# Patient Record
Sex: Female | Born: 1940 | Race: Black or African American | Hispanic: No | Marital: Married | Smoking: Never smoker
Health system: Southern US, Community
[De-identification: ages and names within clinical notes are randomized; demographics above are authoritative.]

## PROBLEM LIST (undated history)

## (undated) DIAGNOSIS — J45909 Unspecified asthma, uncomplicated: Secondary | ICD-10-CM

## (undated) DIAGNOSIS — I499 Cardiac arrhythmia, unspecified: Secondary | ICD-10-CM

## (undated) DIAGNOSIS — D126 Benign neoplasm of colon, unspecified: Secondary | ICD-10-CM

## (undated) DIAGNOSIS — E7800 Pure hypercholesterolemia, unspecified: Secondary | ICD-10-CM

## (undated) DIAGNOSIS — M4800 Spinal stenosis, site unspecified: Secondary | ICD-10-CM

## (undated) DIAGNOSIS — I10 Essential (primary) hypertension: Secondary | ICD-10-CM

## (undated) DIAGNOSIS — E669 Obesity, unspecified: Secondary | ICD-10-CM

## (undated) DIAGNOSIS — E119 Type 2 diabetes mellitus without complications: Secondary | ICD-10-CM

## (undated) DIAGNOSIS — K5790 Diverticulosis of intestine, part unspecified, without perforation or abscess without bleeding: Secondary | ICD-10-CM

## (undated) HISTORY — PX: APPENDECTOMY: SHX54

## (undated) HISTORY — PX: BACK SURGERY: SHX140

## (undated) HISTORY — PX: CARDIAC CATHETERIZATION: SHX172

## (undated) HISTORY — PX: EXPLORATION OF SPINAL FUSION: SUR587

## (undated) HISTORY — PX: TUBAL LIGATION: SHX77

## (undated) HISTORY — PX: COLONOSCOPY: SHX174

## (undated) HISTORY — PX: BREAST BIOPSY: SHX20

## (undated) HISTORY — PX: NECK SURGERY: SHX720

## (undated) HISTORY — PX: ABDOMINAL HYSTERECTOMY: SHX81

---

## 2004-02-16 ENCOUNTER — Encounter: Admission: RE | Admit: 2004-02-16 | Discharge: 2004-02-16 | Payer: Self-pay | Admitting: Unknown Physician Specialty

## 2004-10-10 ENCOUNTER — Encounter: Admission: RE | Admit: 2004-10-10 | Discharge: 2004-10-10 | Payer: Self-pay | Admitting: Internal Medicine

## 2004-11-10 ENCOUNTER — Observation Stay (HOSPITAL_COMMUNITY): Admission: RE | Admit: 2004-11-10 | Discharge: 2004-11-11 | Payer: Self-pay | Admitting: Neurosurgery

## 2004-12-03 ENCOUNTER — Ambulatory Visit: Payer: Self-pay | Admitting: Internal Medicine

## 2005-01-14 ENCOUNTER — Inpatient Hospital Stay (HOSPITAL_COMMUNITY): Admission: RE | Admit: 2005-01-14 | Discharge: 2005-01-15 | Payer: Self-pay | Admitting: Neurosurgery

## 2005-12-14 ENCOUNTER — Ambulatory Visit: Payer: Self-pay | Admitting: Internal Medicine

## 2007-01-03 ENCOUNTER — Ambulatory Visit: Payer: Self-pay | Admitting: Internal Medicine

## 2007-01-26 ENCOUNTER — Ambulatory Visit: Payer: Self-pay | Admitting: Internal Medicine

## 2007-07-25 ENCOUNTER — Ambulatory Visit: Payer: Self-pay | Admitting: Gastroenterology

## 2008-02-07 ENCOUNTER — Ambulatory Visit: Payer: Self-pay | Admitting: Internal Medicine

## 2008-12-30 ENCOUNTER — Ambulatory Visit: Payer: Self-pay | Admitting: Cardiovascular Disease

## 2009-01-03 ENCOUNTER — Ambulatory Visit: Payer: Self-pay | Admitting: Cardiovascular Disease

## 2009-03-19 ENCOUNTER — Ambulatory Visit: Payer: Self-pay | Admitting: Internal Medicine

## 2010-06-24 ENCOUNTER — Ambulatory Visit: Payer: Self-pay | Admitting: Gastroenterology

## 2010-07-13 ENCOUNTER — Ambulatory Visit: Payer: Self-pay | Admitting: Internal Medicine

## 2010-07-16 ENCOUNTER — Ambulatory Visit: Payer: Self-pay | Admitting: Internal Medicine

## 2011-01-29 ENCOUNTER — Emergency Department: Payer: Self-pay | Admitting: Emergency Medicine

## 2011-08-20 ENCOUNTER — Ambulatory Visit: Payer: Self-pay | Admitting: Internal Medicine

## 2011-10-28 ENCOUNTER — Ambulatory Visit: Payer: Self-pay | Admitting: Internal Medicine

## 2012-04-04 ENCOUNTER — Other Ambulatory Visit: Payer: Self-pay | Admitting: Neurosurgery

## 2012-04-04 DIAGNOSIS — IMO0002 Reserved for concepts with insufficient information to code with codable children: Secondary | ICD-10-CM

## 2012-04-04 DIAGNOSIS — M545 Low back pain: Secondary | ICD-10-CM

## 2012-04-04 DIAGNOSIS — Q762 Congenital spondylolisthesis: Secondary | ICD-10-CM

## 2012-04-04 DIAGNOSIS — M47817 Spondylosis without myelopathy or radiculopathy, lumbosacral region: Secondary | ICD-10-CM

## 2012-04-11 ENCOUNTER — Ambulatory Visit
Admission: RE | Admit: 2012-04-11 | Discharge: 2012-04-11 | Disposition: A | Discharge status: Home / Self Care | Payer: Medicare Other | Source: Ambulatory Visit | Attending: Neurosurgery | Admitting: Neurosurgery

## 2012-04-11 DIAGNOSIS — Q762 Congenital spondylolisthesis: Secondary | ICD-10-CM

## 2012-04-11 DIAGNOSIS — M47817 Spondylosis without myelopathy or radiculopathy, lumbosacral region: Secondary | ICD-10-CM

## 2012-04-11 DIAGNOSIS — IMO0002 Reserved for concepts with insufficient information to code with codable children: Secondary | ICD-10-CM

## 2012-04-11 DIAGNOSIS — M545 Low back pain: Secondary | ICD-10-CM

## 2012-04-11 MED ORDER — GADOBENATE DIMEGLUMINE 529 MG/ML IV SOLN
20.0000 mL | Freq: Once | INTRAVENOUS | Status: AC | PRN
  Administered 2012-04-11: 20 mL via INTRAVENOUS

## 2012-12-01 ENCOUNTER — Ambulatory Visit: Payer: Self-pay | Admitting: Internal Medicine

## 2013-08-22 ENCOUNTER — Ambulatory Visit: Payer: Self-pay | Admitting: Family Medicine

## 2013-08-26 ENCOUNTER — Ambulatory Visit: Payer: Self-pay | Admitting: Physician Assistant

## 2014-02-04 ENCOUNTER — Ambulatory Visit: Payer: Self-pay | Admitting: Internal Medicine

## 2014-10-07 ENCOUNTER — Ambulatory Visit: Payer: Self-pay | Admitting: Internal Medicine

## 2014-10-15 ENCOUNTER — Ambulatory Visit
Admit: 2014-10-15 | Disposition: A | Discharge status: Home / Self Care | Payer: Self-pay | Attending: Internal Medicine | Admitting: Internal Medicine

## 2014-10-31 ENCOUNTER — Ambulatory Visit: Payer: Self-pay | Admitting: Gastroenterology

## 2014-10-31 DIAGNOSIS — D126 Benign neoplasm of colon, unspecified: Secondary | ICD-10-CM

## 2014-10-31 HISTORY — DX: Benign neoplasm of colon, unspecified: D12.6

## 2014-11-15 ENCOUNTER — Ambulatory Visit
Admit: 2014-11-15 | Disposition: A | Discharge status: Home / Self Care | Payer: Self-pay | Attending: Internal Medicine | Admitting: Internal Medicine

## 2014-12-09 LAB — SURGICAL PATHOLOGY

## 2015-03-18 ENCOUNTER — Other Ambulatory Visit: Payer: Self-pay | Admitting: Obstetrics and Gynecology

## 2015-04-14 ENCOUNTER — Other Ambulatory Visit: Payer: Self-pay | Admitting: Internal Medicine

## 2015-04-14 DIAGNOSIS — Z1231 Encounter for screening mammogram for malignant neoplasm of breast: Secondary | ICD-10-CM

## 2015-04-16 ENCOUNTER — Ambulatory Visit
Admission: RE | Admit: 2015-04-16 | Discharge: 2015-04-16 | Disposition: A | Discharge status: Home / Self Care | Payer: Medicare Other | Source: Ambulatory Visit | Attending: Internal Medicine | Admitting: Internal Medicine

## 2015-04-16 ENCOUNTER — Other Ambulatory Visit: Payer: Self-pay | Admitting: Internal Medicine

## 2015-04-16 DIAGNOSIS — Z1231 Encounter for screening mammogram for malignant neoplasm of breast: Secondary | ICD-10-CM | POA: Diagnosis present

## 2015-06-19 ENCOUNTER — Encounter: Payer: Self-pay | Admitting: Emergency Medicine

## 2015-06-19 ENCOUNTER — Ambulatory Visit
Admission: EM | Admit: 2015-06-19 | Discharge: 2015-06-19 | Disposition: A | Discharge status: Home / Self Care | Payer: Medicare Other | Attending: Family Medicine | Admitting: Family Medicine

## 2015-06-19 DIAGNOSIS — J0100 Acute maxillary sinusitis, unspecified: Secondary | ICD-10-CM

## 2015-06-19 DIAGNOSIS — R0602 Shortness of breath: Secondary | ICD-10-CM | POA: Diagnosis not present

## 2015-06-19 DIAGNOSIS — J40 Bronchitis, not specified as acute or chronic: Secondary | ICD-10-CM

## 2015-06-19 DIAGNOSIS — H66001 Acute suppurative otitis media without spontaneous rupture of ear drum, right ear: Secondary | ICD-10-CM | POA: Diagnosis not present

## 2015-06-19 HISTORY — DX: Type 2 diabetes mellitus without complications: E11.9

## 2015-06-19 HISTORY — DX: Unspecified asthma, uncomplicated: J45.909

## 2015-06-19 HISTORY — DX: Essential (primary) hypertension: I10

## 2015-06-19 MED ORDER — METHYLPREDNISOLONE SODIUM SUCC 125 MG IJ SOLR
125.0000 mg | Freq: Once | INTRAMUSCULAR | Status: AC
  Administered 2015-06-19: 125 mg via INTRAMUSCULAR

## 2015-06-19 MED ORDER — PREDNISONE 10 MG (21) PO TBPK: ORAL_TABLET | ORAL | Status: DC

## 2015-06-19 MED ORDER — HYDROCOD POLST-CPM POLST ER 10-8 MG/5ML PO SUER: 5.0000 mL | Freq: Two times a day (BID) | ORAL | Status: DC | PRN

## 2015-06-19 MED ORDER — FEXOFENADINE-PSEUDOEPHED ER 180-240 MG PO TB24: 1.0000 | ORAL_TABLET | Freq: Every day | ORAL | Status: DC

## 2015-06-19 MED ORDER — IPRATROPIUM-ALBUTEROL 0.5-2.5 (3) MG/3ML IN SOLN
3.0000 mL | Freq: Once | RESPIRATORY_TRACT | Status: AC
  Administered 2015-06-19: 3 mL via RESPIRATORY_TRACT

## 2015-06-19 MED ORDER — AMOXICILLIN-POT CLAVULANATE 875-125 MG PO TABS: 1.0000 | ORAL_TABLET | Freq: Two times a day (BID) | ORAL | Status: DC

## 2015-06-19 NOTE — Discharge Instructions (Signed)
Sinusitis, Adult Sinusitis is redness, soreness, and puffiness (inflammation) of the air pockets in the bones of your face (sinuses). The redness, soreness, and puffiness can cause air and mucus to get trapped in your sinuses. This can allow germs to grow and cause an infection.  HOME CARE   Drink enough fluids to keep your pee (urine) clear or pale yellow.  Use a humidifier in your home.  Run a hot shower to create steam in the bathroom. Sit in the bathroom with the door closed. Breathe in the steam 3-4 times a day.  Put a warm, moist washcloth on your face 3-4 times a day, or as told by your doctor.  Use salt water sprays (saline sprays) to wet the thick fluid in your nose. This can help the sinuses drain.  Only take medicine as told by your doctor. GET HELP RIGHT AWAY IF:   Your pain gets worse.  You have very bad headaches.  You are sick to your stomach (nauseous).  You throw up (vomit).  You are very sleepy (drowsy) all the time.  Your face is puffy (swollen).  Your vision changes.  You have a stiff neck.  You have trouble breathing. MAKE SURE YOU:   Understand these instructions.  Will watch your condition.  Will get help right away if you are not doing well or get worse.   This information is not intended to replace advice given to you by your health care provider. Make sure you discuss any questions you have with your health care provider.   Document Released: 01/19/2008 Document Revised: 08/23/2014 Document Reviewed: 03/07/2012 Elsevier Interactive Patient Education 2016 Elsevier Inc.  Upper Respiratory Infection, Adult Most upper respiratory infections (URIs) are caused by a virus. A URI affects the nose, throat, and upper air passages. The most common type of URI is often called "the common cold." HOME CARE   Take medicines only as told by your doctor.  Gargle warm saltwater or take cough drops to comfort your throat as told by your doctor.  Use a  warm mist humidifier or inhale steam from a shower to increase air moisture. This may make it easier to breathe.  Drink enough fluid to keep your pee (urine) clear or pale yellow.  Eat soups and other clear broths.  Have a healthy diet.  Rest as needed.  Go back to work when your fever is gone or your doctor says it is okay.  You may need to stay home longer to avoid giving your URI to others.  You can also wear a face mask and wash your hands often to prevent spread of the virus.  Use your inhaler more if you have asthma.  Do not use any tobacco products, including cigarettes, chewing tobacco, or electronic cigarettes. If you need help quitting, ask your doctor. GET HELP IF:  You are getting worse, not better.  Your symptoms are not helped by medicine.  You have chills.  You are getting more short of breath.  You have brown or red mucus.  You have yellow or brown discharge from your nose.  You have pain in your face, especially when you bend forward.  You have a fever.  You have puffy (swollen) neck glands.  You have pain while swallowing.  You have white areas in the back of your throat. GET HELP RIGHT AWAY IF:   You have very bad or constant:  Headache.  Ear pain.  Pain in your forehead, behind your eyes, and over  your cheekbones (sinus pain).  Chest pain.  You have long-lasting (chronic) lung disease and any of the following:  Wheezing.  Long-lasting cough.  Coughing up blood.  A change in your usual mucus.  You have a stiff neck.  You have changes in your:  Vision.  Hearing.  Thinking.  Mood. MAKE SURE YOU:   Understand these instructions.  Will watch your condition.  Will get help right away if you are not doing well or get worse.   This information is not intended to replace advice given to you by your health care provider. Make sure you discuss any questions you have with your health care provider.   Document Released:  01/19/2008 Document Revised: 12/17/2014 Document Reviewed: 11/07/2013 Elsevier Interactive Patient Education [employer].

## 2015-06-19 NOTE — ED Notes (Signed)
Patient states she has a lot of congestion both sinus and chest, started yesterday

## 2015-06-19 NOTE — ED Provider Notes (Signed)
CSN: [id number]     Arrival date & time 06/19/15  1227 History   First MD Initiated Contact with Patient 06/19/15 1311     Chief Complaint  Patient presents with  . Nasal Congestion   (Consider location/radiation/quality/duration/timing/severity/associated sxs/prior Treatment) Patient is a 74 y.o. female presenting with shortness of breath. The history is provided by the patient. No language interpreter was used.  Shortness of Breath Severity:  Moderate Onset quality:  Sudden Duration:  2 days Timing:  Constant Progression:  Unable to specify Chronicity:  New Context: URI   Context: not activity, not smoke exposure and not strong odors   Relieved by:  Nothing Ineffective treatments:  Inhaler Associated symptoms: cough, ear pain, sore throat, sputum production and swollen glands   Associated symptoms: no chest pain and no rash   Risk factors: no recent surgery     Past Medical History  Diagnosis Date  . Diabetes mellitus without complication ([place])   . Hypertension   . Asthma    Past Surgical History  Procedure Laterality Date  . Breast biopsy Right     negative 1970's  . Abdominal hysterectomy    . Back surgery     History reviewed. No pertinent family history. Social History  Substance Use Topics  . Smoking status: Never Smoker   . Smokeless tobacco: None  . Alcohol Use: No   OB History    No data available     Review of Systems  HENT: Positive for ear pain and sore throat.   Respiratory: Positive for cough, sputum production and shortness of breath.   Cardiovascular: Negative for chest pain.  Skin: Negative for rash.  All other systems reviewed and are negative.   Allergies  Adhesive and Iodine  Home Medications   Prior to Admission medications   Medication Sig Start Date End Date Taking? Authorizing Provider  albuterol (PROVENTIL HFA;VENTOLIN HFA) 108 (90 BASE) MCG/ACT inhaler Inhale into the lungs every 6 (six) hours as needed for wheezing or  shortness of breath.   Yes Historical Provider, MD  budesonide-formoterol (SYMBICORT) 160-4.5 MCG/ACT inhaler Inhale 2 puffs into the lungs 2 (two) times daily.   Yes Historical Provider, MD  fexofenadine (ALLEGRA) 30 MG tablet Take 30 mg by mouth 2 (two) times daily.   Yes Historical Provider, MD  furosemide (LASIX) 10 MG/ML solution Take by mouth daily.   Yes Historical Provider, MD  metFORMIN (GLUCOPHAGE) 1000 MG tablet Take 1,000 mg by mouth 2 (two) times daily with a meal.   Yes Historical Provider, MD  metoprolol succinate (TOPROL-XL) 100 MG 24 hr tablet Take 100 mg by mouth daily. Take with or immediately following a meal.   Yes Historical Provider, MD  montelukast (SINGULAIR) 10 MG tablet Take 10 mg by mouth at bedtime.   Yes Historical Provider, MD  potassium chloride (K-DUR,KLOR-CON) 10 MEQ tablet Take 10 mEq by mouth 2 (two) times daily.   Yes Historical Provider, MD  rosuvastatin (CRESTOR) 10 MG tablet Take 10 mg by mouth daily.   Yes Historical Provider, MD  valsartan-hydrochlorothiazide (DIOVAN-HCT) 160-12.5 MG tablet Take 1 tablet by mouth daily.   Yes Historical Provider, MD  amoxicillin-clavulanate (AUGMENTIN) 875-125 MG tablet Take 1 tablet by mouth 2 (two) times daily. 06/19/15   Frederich Cha, MD  chlorpheniramine-HYDROcodone [facility]) 10-8 MG/5ML SUER Take 5 mLs by mouth every 12 (twelve) hours as needed for cough. 06/19/15   Frederich Cha, MD  fexofenadine-pseudoephedrine (ALLEGRA-D ALLERGY & CONGESTION) 180-240 MG 24 hr tablet  Take 1 tablet by mouth daily. 06/19/15   Frederich Cha, MD  predniSONE (STERAPRED UNI-PAK 21 TAB) 10 MG (21) TBPK tablet Sig 6 tablet day 1, 5 tablets day 2, 4 tablets day 3,,3tablets day 4, 2 tablets day 5, 1 tablet day 6 take all tablets orally 06/19/15   Frederich Cha, MD   Meds Ordered and Administered this Visit   Medications  ipratropium-albuterol (DUONEB) 0.5-2.5 (3) MG/3ML nebulizer solution 3 mL (3 mLs Nebulization Given 06/19/15 1425)   methylPREDNISolone sodium succinate (SOLU-MEDROL) 125 mg/2 mL injection 125 mg (125 mg Intramuscular Given 06/19/15 1425)    BP 162/70 mmHg  Pulse 66  Temp(Src) 98.7 F (37.1 C) (Tympanic)  Resp 22  Ht 5\' 2"  (1.575 m)  Wt 224 lb (101.606 kg)  BMI 40.96 kg/m2  SpO2 97% No data found.   Physical Exam  Constitutional: She is oriented to person, place, and time. She appears well-developed and well-nourished.  HENT:  Head: Normocephalic and atraumatic.  Right Ear: External ear normal. No drainage or swelling. Tympanic membrane is erythematous and bulging.  Left Ear: External ear normal. No drainage. Tympanic membrane is not erythematous and not bulging.  Mouth/Throat: Normal dentition. Posterior oropharyngeal erythema present.  Eyes: Pupils are equal, round, and reactive to light.  Neck: Normal range of motion. Neck supple.  Cardiovascular: Normal rate, regular rhythm and normal heart sounds.   Pulmonary/Chest: Effort normal. She has wheezes. She exhibits no tenderness.  Musculoskeletal: Normal range of motion.  Lymphadenopathy:    She has cervical adenopathy.  Neurological: She is alert and oriented to person, place, and time. No cranial nerve deficit.  Skin: Skin is warm and dry.  Psychiatric: She has a normal mood and affect.  Vitals reviewed.   ED Course  Procedures (including critical care time)  Labs Review Labs Reviewed - No data to display  Imaging Review No results found.   Visual Acuity Review  Right Eye Distance:   Left Eye Distance:   Bilateral Distance:    Right Eye Near:   Left Eye Near:    Bilateral Near:         MDM   1. Acute maxillary sinusitis, recurrence not specified   2. Bronchitis   3. Acute suppurative otitis media of right ear without spontaneous rupture of tympanic membrane, recurrence not specified   4. Shortness of breath        Frederich Cha, MD 06/19/15 1430

## 2015-06-23 ENCOUNTER — Encounter: Payer: Self-pay | Admitting: Dietician

## 2015-08-21 ENCOUNTER — Emergency Department
Admission: EM | Admit: 2015-08-21 | Discharge: 2015-08-21 | Disposition: A | Discharge status: Home / Self Care | Payer: Medicare Other | Attending: Emergency Medicine | Admitting: Emergency Medicine

## 2015-08-21 ENCOUNTER — Encounter: Payer: Self-pay | Admitting: Emergency Medicine

## 2015-08-21 DIAGNOSIS — I10 Essential (primary) hypertension: Secondary | ICD-10-CM | POA: Diagnosis not present

## 2015-08-21 DIAGNOSIS — E119 Type 2 diabetes mellitus without complications: Secondary | ICD-10-CM | POA: Insufficient documentation

## 2015-08-21 DIAGNOSIS — R197 Diarrhea, unspecified: Secondary | ICD-10-CM | POA: Diagnosis not present

## 2015-08-21 DIAGNOSIS — R109 Unspecified abdominal pain: Secondary | ICD-10-CM | POA: Insufficient documentation

## 2015-08-21 LAB — URINALYSIS COMPLETE WITH MICROSCOPIC (ARMC ONLY)
BILIRUBIN URINE: NEGATIVE
GLUCOSE, UA: NEGATIVE mg/dL
Hgb urine dipstick: NEGATIVE
KETONES UR: NEGATIVE mg/dL
Leukocytes, UA: NEGATIVE
NITRITE: NEGATIVE
Protein, ur: NEGATIVE mg/dL
SPECIFIC GRAVITY, URINE: 1.016 (ref 1.005–1.030)
pH: 5 (ref 5.0–8.0)

## 2015-08-21 LAB — LIPASE, BLOOD: LIPASE: 26 U/L (ref 11–51)

## 2015-08-21 LAB — C DIFFICILE QUICK SCREEN W PCR REFLEX
C DIFFICILE (CDIFF) INTERP: NEGATIVE
C Diff antigen: NEGATIVE
C Diff toxin: NEGATIVE

## 2015-08-21 LAB — CBC WITH DIFFERENTIAL/PLATELET
Basophils Absolute: 0 10*3/uL (ref 0–0.1)
Basophils Relative: 0 %
EOS ABS: 0.3 10*3/uL (ref 0–0.7)
EOS PCT: 3 %
HCT: 38.3 % (ref 35.0–47.0)
Hemoglobin: 12.3 g/dL (ref 12.0–16.0)
LYMPHS ABS: 2.1 10*3/uL (ref 1.0–3.6)
LYMPHS PCT: 19 %
MCH: 27.7 pg (ref 26.0–34.0)
MCHC: 32.1 g/dL (ref 32.0–36.0)
MCV: 86.2 fL (ref 80.0–100.0)
MONOS PCT: 6 %
Monocytes Absolute: 0.7 10*3/uL (ref 0.2–0.9)
Neutro Abs: 8 10*3/uL — ABNORMAL HIGH (ref 1.4–6.5)
Neutrophils Relative %: 72 %
PLATELETS: 323 10*3/uL (ref 150–440)
RBC: 4.44 MIL/uL (ref 3.80–5.20)
RDW: 14.6 % — ABNORMAL HIGH (ref 11.5–14.5)
WBC: 11.1 10*3/uL — AB (ref 3.6–11.0)

## 2015-08-21 LAB — GASTROINTESTINAL PANEL BY PCR, STOOL (REPLACES STOOL CULTURE)
Adenovirus F40/41: NOT DETECTED
Astrovirus: NOT DETECTED
CAMPYLOBACTER SPECIES: NOT DETECTED
CRYPTOSPORIDIUM: NOT DETECTED
Cyclospora cayetanensis: NOT DETECTED
E. COLI O157: NOT DETECTED
ENTEROAGGREGATIVE E COLI (EAEC): NOT DETECTED
Entamoeba histolytica: NOT DETECTED
Enteropathogenic E coli (EPEC): NOT DETECTED
Enterotoxigenic E coli (ETEC): NOT DETECTED
GIARDIA LAMBLIA: NOT DETECTED
NOROVIRUS GI/GII: NOT DETECTED
PLESIMONAS SHIGELLOIDES: NOT DETECTED
ROTAVIRUS A: NOT DETECTED
SALMONELLA SPECIES: NOT DETECTED
SAPOVIRUS (I, II, IV, AND V): NOT DETECTED
SHIGA LIKE TOXIN PRODUCING E COLI (STEC): NOT DETECTED
SHIGELLA/ENTEROINVASIVE E COLI (EIEC): NOT DETECTED
Vibrio cholerae: NOT DETECTED
Vibrio species: NOT DETECTED
Yersinia enterocolitica: NOT DETECTED

## 2015-08-21 LAB — COMPREHENSIVE METABOLIC PANEL
ALK PHOS: 52 U/L (ref 38–126)
ALT: 11 U/L — AB (ref 14–54)
ANION GAP: 9 (ref 5–15)
AST: 15 U/L (ref 15–41)
Albumin: 3.7 g/dL (ref 3.5–5.0)
BUN: 14 mg/dL (ref 6–20)
CALCIUM: 10.8 mg/dL — AB (ref 8.9–10.3)
CHLORIDE: 101 mmol/L (ref 101–111)
CO2: 27 mmol/L (ref 22–32)
CREATININE: 0.72 mg/dL (ref 0.44–1.00)
GFR calc Af Amer: >60 mL/min (ref >60)
Glucose, Bld: 123 mg/dL — ABNORMAL HIGH (ref 65–99)
Potassium: 3.8 mmol/L (ref 3.5–5.1)
SODIUM: 137 mmol/L (ref 135–145)
Total Bilirubin: 1 mg/dL (ref 0.3–1.2)
Total Protein: 7.6 g/dL (ref 6.5–8.1)

## 2015-08-21 MED ORDER — ESOMEPRAZOLE MAGNESIUM 40 MG PO CPDR: 40.0000 mg | DELAYED_RELEASE_CAPSULE | Freq: Every day | ORAL | Status: AC

## 2015-08-21 MED ORDER — DIPHENOXYLATE-ATROPINE 2.5-0.025 MG PO TABS: 1.0000 | ORAL_TABLET | Freq: Four times a day (QID) | ORAL | Status: AC | PRN

## 2015-08-21 NOTE — Discharge Instructions (Signed)
Chronic Diarrhea Diarrhea is frequent loose and watery bowel movements. It can cause you to feel weak and dehydrated. Dehydration can cause you to become tired and thirsty and to have a dry mouth, decreased urination, and dark yellow urine. Diarrhea is a sign of another problem, most often an infection that will not last long. In most cases, diarrhea lasts 2-3 days. Diarrhea that lasts longer than 4 weeks is called long-lasting (chronic) diarrhea. It is important to treat your diarrhea as directed by your health care provider to lessen or prevent future episodes of diarrhea.  CAUSES  There are many causes of chronic diarrhea. The following are some possible causes:   Gastrointestinal infections caused by viruses, bacteria, or parasites.   Food poisoning or food allergies.   Certain medicines, such as antibiotics, chemotherapy, and laxatives.   Artificial sweeteners and fructose.   Digestive disorders, such as celiac disease and inflammatory bowel diseases.   Irritable bowel syndrome.  Some disorders of the pancreas.  Disorders of the thyroid.  Reduced blood flow to the intestines.  Cancer. Sometimes the cause of chronic diarrhea is unknown. RISK FACTORS  Having a severely weakened immune system, such as from HIV or AIDS.   Taking certain types of cancer-fighting drugs (such as with chemotherapy) or other medicines.   Having had a recent organ transplant.   Having a portion of the stomach or small bowel removed.   Traveling to countries where food and water supplies are often contaminated.  SYMPTOMS  In addition to frequent, loose stools, diarrhea may cause:   Cramping.   Abdominal pain.   Nausea.   Fever.  Fatigue.  Urgent need to use the bathroom.  Loss of bowel control. DIAGNOSIS  Your health care provider must take a careful history and perform a physical exam. Tests given are based on your symptoms and history. Tests may include:   Blood or  stool tests. Three or more stool samples may be examined. Stool cultures may be used to test for bacteria or parasites.   X-rays.   A procedure in which a thin tube is inserted into the mouth or rectum (endoscopy). This allows the health care provider to look inside the intestine.  TREATMENT   Treatment is aimed at correcting the cause of the diarrhea when possible.  Diarrhea caused by an infection can often be treated with antibiotic medicines.  Diarrhea not caused by an infection may require you to take long-term medicine or have surgery. Specific treatment should be discussed with your health care provider.  If the cause cannot be determined, treatment aims to relieve symptoms and prevent dehydration. Serious health problems can occur if you do not maintain proper fluid levels. Treatment may include:  Taking an oral rehydration solution (ORS).  Not drinking beverages that contain caffeine (such as tea, coffee, and soft drinks).  Not drinking alcohol.  Maintaining well-balanced nutrition to help you recover faster. HOME CARE INSTRUCTIONS   Drink enough fluids to keep urine clear or pale yellow. Drink 1 cup (8 oz) of fluid for each diarrhea episode. Avoid fluids that contain simple sugars, fruit juices, whole milk products, and sodas. Hydrate with an ORS. You may purchase the ORS or prepare it at home by mixing the following ingredients together:   - tsp (1.7-3  mL) table salt.   tsp (3  mL) baking soda.   tsp (1.7 mL) salt substitute containing potassium chloride.  1 tbsp (20 mL) sugar.  4.2 c (1 L) of water.     Certain foods and beverages may increase the speed at which food moves through the gastrointestinal (GI) tract. These foods and beverages should be avoided. They include:  Caffeinated and alcoholic beverages.  High-fiber foods, such as raw fruits and vegetables, nuts, seeds, and whole grain breads and cereals.  Foods and beverages sweetened with sugar  alcohols, such as xylitol, sorbitol, and mannitol.   Some foods may be well tolerated and may help thicken stool. These include:  Starchy foods, such as rice, toast, pasta, low-sugar cereal, oatmeal, grits, baked potatoes, crackers, and bagels.  Bananas.  Applesauce.  Add probiotic-rich foods to help increase healthy bacteria in the GI tract. These include yogurt and fermented milk products.  Wash your hands well after each diarrhea episode.  Only take over-the-counter or prescription medicines as directed by your health care provider.  Take a warm bath to relieve any burning or pain from frequent diarrhea episodes. SEEK MEDICAL CARE IF:   You are not urinating as often.  Your urine is a dark color.  You become very tired or dizzy.  You have severe pain in the abdomen or rectum.  Your have blood or pus in your stools.  Your stools look black and tarry. SEEK IMMEDIATE MEDICAL CARE IF:   You are unable to keep fluids down.  You have persistent vomiting.  You have blood in your stool.  Your stools are black and tarry.  You do not urinate in 6-8 hours, or there is only a small amount of very dark urine.  You have abdominal pain that increases or localizes.  You have weakness, dizziness, confusion, or lightheadedness.  You have a severe headache.  Your diarrhea gets worse or does not get better.  You have a fever or persistent symptoms for more than 2-3 days.  You have a fever and your symptoms suddenly get worse. MAKE SURE YOU:   Understand these instructions.  Will watch your condition.  Will get help right away if you are not doing well or get worse.   This information is not intended to replace advice given to you by your health care provider. Make sure you discuss any questions you have with your health care provider.   Document Released: 10/23/2003 Document Revised: 08/07/2013 Document Reviewed: 01/25/2013 Elsevier Interactive Patient Education 2016  Elsevier Inc.  

## 2015-08-21 NOTE — ED Notes (Signed)
Pt states she has been having diarrhea for approx 3 months. Pt states she has been increasingly "bubbley" for the last 2-3 weeks. Pt states she will have an episode of abdominal pain then have an episode of diarrhea. Pt states she feels the "bubbling" from the bottom of her stomach up into her throat. Pt states she is trying to see a GI doctor at this time. Pt states previous colonoscopy that "didn't show anything". NAD noted at this time, skin is warm and dry, pt alert, oriented and ambulatory at this time.

## 2015-08-21 NOTE — ED Notes (Signed)
Assessment completed  - pt assisted to the BR

## 2015-08-21 NOTE — ED Provider Notes (Signed)
[facility] Emergency Department Provider Note     Time seen: ----------------------------------------- 8:21 AM on 08/21/2015 -----------------------------------------    I have reviewed the triage vital signs and the nursing notes.   HISTORY  Chief Complaint Abdominal Pain    HPI Erin Mcintosh is a 75 y.o. female who presents ER for diarrhea for the last 3 months. Patient states she's had increasing gas and a "bubbly" sensation for last 2-3 weeks. Patient states she'll have an episode of abdominal pain and have an episode of diarrhea. Patient states she has diarrhea exactly one hour after eating. She has diarrhea every day multiple times a day, she been trying to get him see a GI doctor. She was told previously she had a colonoscopy that was normal. She denies fevers chills or other complaints.   Past Medical History  Diagnosis Date  . Diabetes mellitus without complication ([place])   . Hypertension   . Asthma     There are no active problems to display for this patient.   Past Surgical History  Procedure Laterality Date  . Breast biopsy Right     negative 1970's  . Abdominal hysterectomy    . Back surgery    . Neck surgery    . Colonoscopy      Allergies Ace inhibitors; Adhesive; and Iodine  Social History Social History  Substance Use Topics  . Smoking status: Never Smoker   . Smokeless tobacco: None  . Alcohol Use: No    Review of Systems Constitutional: Negative for fever. Eyes: Negative for visual changes. ENT: Negative for sore throat. Cardiovascular: Negative for chest pain. Respiratory: Negative for shortness of breath. Gastrointestinal: Positive for abdominal pain and diarrhea Genitourinary: Negative for dysuria. Musculoskeletal: Negative for back pain. Skin: Negative for rash. Neurological: Negative for headaches, focal weakness or numbness.  10-point ROS otherwise  negative.  ____________________________________________   PHYSICAL EXAM:  VITAL SIGNS: ED Triage Vitals  Enc Vitals Group     BP 08/21/15 0818 146/83 mmHg     Pulse Rate 08/21/15 0818 66     Resp 08/21/15 0818 18     Temp 08/21/15 0818 97.9 F (36.6 C)     Temp Source 08/21/15 0818 Oral     SpO2 08/21/15 0818 97 %     Weight 08/21/15 0818 218 lb (98.884 kg)     Height 08/21/15 0818 5\' 2"  (1.575 m)     Head Cir --      Peak Flow --      Pain Score --      Pain Loc --      Pain Edu? --      Excl. in [place]? --     Constitutional: Alert and oriented. Well appearing and in no distress. Eyes: Conjunctivae are normal. PERRL. Normal extraocular movements. ENT   Head: Normocephalic and atraumatic.   Nose: No congestion/rhinnorhea.   Mouth/Throat: Mucous membranes are moist.   Neck: No stridor. Cardiovascular: Normal rate, regular rhythm. Normal and symmetric distal pulses are present in all extremities. No murmurs, rubs, or gallops. Respiratory: Normal respiratory effort without tachypnea nor retractions. Breath sounds are clear and equal bilaterally. No wheezes/rales/rhonchi. Gastrointestinal: Soft and nontender. No distention. No abdominal bruits.  Musculoskeletal: Nontender with normal range of motion in all extremities. No joint effusions.  No lower extremity tenderness nor edema. Neurologic:  Normal speech and language. No gross focal neurologic deficits are appreciated. Speech is normal. No gait instability. Skin:  Skin is warm, dry and  intact. No rash noted. Psychiatric: Mood and affect are normal. Speech and behavior are normal. Patient exhibits appropriate insight and judgment. ____________________________________________  EKG: Interpreted by me. Normal sinus rhythm with a rate of 69 bpm, left axis deviation, normal QRS with, normal QT interval. Nonspecific T-wave changes.  ____________________________________________  ED COURSE:  Pertinent labs & imaging  results that were available during my care of the patient were reviewed by me and considered in my medical decision making (see chart for details). Patient's distress, will check basic labs obtain stool studies ____________________________________________    LABS (pertinent positives/negatives)  Labs Reviewed  CBC WITH DIFFERENTIAL/PLATELET - Abnormal; Notable for the following:    WBC 11.1 (*)    RDW 14.6 (*)    Neutro Abs 8.0 (*)    All other components within normal limits  COMPREHENSIVE METABOLIC PANEL - Abnormal; Notable for the following:    Glucose, Bld 123 (*)    Calcium 10.8 (*)    ALT 11 (*)    All other components within normal limits  URINALYSIS COMPLETEWITH MICROSCOPIC ([facility] ONLY) - Abnormal; Notable for the following:    Color, Urine YELLOW (*)    APPearance HAZY (*)    Bacteria, UA RARE (*)    Squamous Epithelial / LPF 6-30 (*)    All other components within normal limits  C DIFFICILE QUICK SCREEN W PCR REFLEX  GASTROINTESTINAL PANEL BY PCR, STOOL (REPLACES STOOL CULTURE)  LIPASE, BLOOD   ____________________________________________  FINAL ASSESSMENT AND PLAN  Abdominal pain, diarrhea  Plan: Patient with labs and imaging as dictated above. Patient with acute on chronic diarrhea, she is unable to provide a stool specimen here and has not had a diarrheal episode in the last 12 hours. I will place her on Lomotil and restart Nexium. She is advised to have close follow-up with GI.   Earleen Newport, MD   Earleen Newport, MD 08/21/15 [id number]

## 2015-11-24 ENCOUNTER — Encounter: Payer: Self-pay | Admitting: *Deleted

## 2015-11-25 ENCOUNTER — Ambulatory Visit
Admission: RE | Admit: 2015-11-25 | Discharge: 2015-11-25 | Disposition: A | Discharge status: Home / Self Care | Payer: Medicare Other | Source: Ambulatory Visit | Attending: Gastroenterology | Admitting: Gastroenterology

## 2015-11-25 ENCOUNTER — Encounter: Payer: Self-pay | Admitting: *Deleted

## 2015-11-25 ENCOUNTER — Ambulatory Visit: Payer: Medicare Other | Admitting: Certified Registered Nurse Anesthetist

## 2015-11-25 ENCOUNTER — Encounter
Admission: RE | Disposition: A | Discharge status: Home / Self Care | Payer: Self-pay | Source: Ambulatory Visit | Attending: Gastroenterology

## 2015-11-25 DIAGNOSIS — E7800 Pure hypercholesterolemia, unspecified: Secondary | ICD-10-CM | POA: Diagnosis not present

## 2015-11-25 DIAGNOSIS — Z6838 Body mass index (BMI) 38.0-38.9, adult: Secondary | ICD-10-CM | POA: Diagnosis not present

## 2015-11-25 DIAGNOSIS — Z7984 Long term (current) use of oral hypoglycemic drugs: Secondary | ICD-10-CM | POA: Insufficient documentation

## 2015-11-25 DIAGNOSIS — I10 Essential (primary) hypertension: Secondary | ICD-10-CM | POA: Insufficient documentation

## 2015-11-25 DIAGNOSIS — E669 Obesity, unspecified: Secondary | ICD-10-CM | POA: Insufficient documentation

## 2015-11-25 DIAGNOSIS — E119 Type 2 diabetes mellitus without complications: Secondary | ICD-10-CM | POA: Diagnosis not present

## 2015-11-25 DIAGNOSIS — M4800 Spinal stenosis, site unspecified: Secondary | ICD-10-CM | POA: Diagnosis not present

## 2015-11-25 DIAGNOSIS — K210 Gastro-esophageal reflux disease with esophagitis: Secondary | ICD-10-CM | POA: Diagnosis not present

## 2015-11-25 DIAGNOSIS — Z79899 Other long term (current) drug therapy: Secondary | ICD-10-CM | POA: Insufficient documentation

## 2015-11-25 DIAGNOSIS — D721 Eosinophilia: Secondary | ICD-10-CM | POA: Insufficient documentation

## 2015-11-25 DIAGNOSIS — Z91048 Other nonmedicinal substance allergy status: Secondary | ICD-10-CM | POA: Insufficient documentation

## 2015-11-25 DIAGNOSIS — K2970 Gastritis, unspecified, without bleeding: Secondary | ICD-10-CM | POA: Insufficient documentation

## 2015-11-25 DIAGNOSIS — Z91041 Radiographic dye allergy status: Secondary | ICD-10-CM | POA: Insufficient documentation

## 2015-11-25 DIAGNOSIS — K5790 Diverticulosis of intestine, part unspecified, without perforation or abscess without bleeding: Secondary | ICD-10-CM | POA: Diagnosis not present

## 2015-11-25 DIAGNOSIS — Z888 Allergy status to other drugs, medicaments and biological substances status: Secondary | ICD-10-CM | POA: Insufficient documentation

## 2015-11-25 DIAGNOSIS — Z86010 Personal history of colonic polyps: Secondary | ICD-10-CM | POA: Diagnosis not present

## 2015-11-25 DIAGNOSIS — Z7951 Long term (current) use of inhaled steroids: Secondary | ICD-10-CM | POA: Diagnosis not present

## 2015-11-25 DIAGNOSIS — R197 Diarrhea, unspecified: Secondary | ICD-10-CM | POA: Diagnosis present

## 2015-11-25 DIAGNOSIS — J45909 Unspecified asthma, uncomplicated: Secondary | ICD-10-CM | POA: Insufficient documentation

## 2015-11-25 HISTORY — DX: Pure hypercholesterolemia, unspecified: E78.00

## 2015-11-25 HISTORY — DX: Obesity, unspecified: E66.9

## 2015-11-25 HISTORY — DX: Diverticulosis of intestine, part unspecified, without perforation or abscess without bleeding: K57.90

## 2015-11-25 HISTORY — DX: Benign neoplasm of colon, unspecified: D12.6

## 2015-11-25 HISTORY — DX: Cardiac arrhythmia, unspecified: I49.9

## 2015-11-25 HISTORY — DX: Spinal stenosis, site unspecified: M48.00

## 2015-11-25 HISTORY — PX: ESOPHAGOGASTRODUODENOSCOPY (EGD) WITH PROPOFOL: SHX5813

## 2015-11-25 LAB — GLUCOSE, CAPILLARY: Glucose-Capillary: 88 mg/dL (ref 65–99)

## 2015-11-25 SURGERY — ESOPHAGOGASTRODUODENOSCOPY (EGD) WITH PROPOFOL
Anesthesia: General

## 2015-11-25 MED ORDER — PHENYLEPHRINE HCL 10 MG/ML IJ SOLN
INTRAMUSCULAR | Status: DC | PRN
  Administered 2015-11-25: 100 ug via INTRAVENOUS

## 2015-11-25 MED ORDER — PROPOFOL 10 MG/ML IV BOLUS
INTRAVENOUS | Status: DC | PRN
  Administered 2015-11-25: 40 mg via INTRAVENOUS

## 2015-11-25 MED ORDER — SODIUM CHLORIDE 0.9 % IV SOLN: INTRAVENOUS | Status: DC

## 2015-11-25 MED ORDER — SODIUM CHLORIDE 0.9 % IV SOLN
INTRAVENOUS | Status: DC
  Administered 2015-11-25: 1000 mL via INTRAVENOUS

## 2015-11-25 MED ORDER — PROPOFOL 500 MG/50ML IV EMUL
INTRAVENOUS | Status: DC | PRN
  Administered 2015-11-25: 120 ug/kg/min via INTRAVENOUS

## 2015-11-25 MED ORDER — LIDOCAINE HCL (CARDIAC) 20 MG/ML IV SOLN
INTRAVENOUS | Status: DC | PRN
  Administered 2015-11-25: 100 mg via INTRAVENOUS

## 2015-11-25 MED ORDER — FENTANYL CITRATE (PF) 100 MCG/2ML IJ SOLN
INTRAMUSCULAR | Status: DC | PRN
  Administered 2015-11-25: 50 ug via INTRAVENOUS

## 2015-11-25 NOTE — Transfer of Care (Signed)
Immediate Anesthesia Transfer of Care Note  Patient: Erin Mcintosh  Procedure(s) Performed: Procedure(s): ESOPHAGOGASTRODUODENOSCOPY (EGD) WITH PROPOFOL (N/A)  Patient Location: PACU  Anesthesia Type:General  Level of Consciousness: awake, alert  and oriented  Airway & Oxygen Therapy: Patient Spontanous Breathing and Patient connected to nasal cannula oxygen  Post-op Assessment: Report given to RN and Post -op Vital signs reviewed and stable  Post vital signs: Reviewed and stable  Last Vitals:  Filed Vitals:   11/25/15 1344  BP: 155/72  Pulse: 58  Temp: 36.1 C  Resp: 20    Complications: No apparent anesthesia complications

## 2015-11-25 NOTE — Anesthesia Postprocedure Evaluation (Signed)
Anesthesia Post Note  Patient: Erin Mcintosh  Procedure(s) Performed: Procedure(s) (LRB): ESOPHAGOGASTRODUODENOSCOPY (EGD) WITH PROPOFOL (N/A)  Patient location during evaluation: Endoscopy Anesthesia Type: General Level of consciousness: awake and alert Pain management: pain level controlled Vital Signs Assessment: post-procedure vital signs reviewed and stable Respiratory status: spontaneous breathing, nonlabored ventilation, respiratory function stable and patient connected to nasal cannula oxygen Cardiovascular status: blood pressure returned to baseline and stable Postop Assessment: no signs of nausea or vomiting Anesthetic complications: no    Last Vitals:  Filed Vitals:   11/25/15 1611 11/25/15 1620  BP: 135/66 118/49  Pulse: 53 53  Temp:    Resp: 13 12    Last Pain: There were no vitals filed for this visit.               Precious Haws Erynne Kealey

## 2015-11-25 NOTE — Op Note (Signed)
[facility] ([place] Gastroenterology Patient Name: Miss Dugal Procedure Date: 11/25/2015 3:22 PM MRN: [id number] Account #: [id number] Date of Birth: 02/05/1941 Admit Type: Outpatient Age: 75 Room: [facility] ENDO ROOM 3 Gender: Female Note Status: Finalized Procedure:            Upper GI endoscopy Indications:          Endoscopy to assess diarrhea in patient suspected of                        having celiac disease Providers:            Lollie Sails, MD Referring MD:         Venetia Maxon. Elijio Miles, MD (Referring MD) Medicines:            Sedation Required Anesthesia Staff Assistance Complications:        No immediate complications. Procedure:            Pre-Anesthesia Assessment:                       - ASA Grade Assessment: III - A patient with severe                        systemic disease.                       After obtaining informed consent, the endoscope was                        passed under direct vision. Throughout the procedure,                        the patient's blood pressure, pulse, and oxygen                        saturations were monitored continuously. The Endoscope                        was introduced through the mouth, and advanced to the                        third part of duodenum. The upper GI endoscopy was                        accomplished without difficulty. The patient tolerated                        the procedure well. Findings:      The Z-line was variable. Biopsies were taken with a cold forceps for       histology.      The exam of the esophagus was otherwise normal.      Patchy mild inflammation characterized by erythema was found in the       gastric antrum. Biopsies were taken with a cold forceps for histology.      The cardia and gastric fundus were normal on retroflexion.      Diffuse mild mucosal variance characterized by altered texture was found       in the entire duodenum. Biopsies were taken with a cold forceps for     histology. Impression:           - Z-line  variable. Biopsied.                       - Gastritis. Biopsied.                       - Mucosal variant in the duodenum. Biopsied. Recommendation:       - Discharge patient to home.                       - Return to GI clinic in 3 weeks. Procedure Code(s):    --- Professional ---                       [phone], Esophagogastroduodenoscopy, flexible, transoral;                        with biopsy, single or multiple Diagnosis Code(s):    --- Professional ---                       K22.8, Other specified diseases of esophagus                       K29.70, Gastritis, unspecified, without bleeding                       K31.89, Other diseases of stomach and duodenum                       R19.7, Diarrhea, unspecified CPT copyright 2016 American Medical Association. All rights reserved. The codes documented in this report are preliminary and upon coder review may  be revised to meet current compliance requirements. Lollie Sails, MD 11/25/2015 3:48:02 PM This report has been signed electronically. Number of Addenda: 0 Note Initiated On: 11/25/2015 3:22 PM      [facility]

## 2015-11-25 NOTE — Anesthesia Preprocedure Evaluation (Signed)
Anesthesia Evaluation  Patient identified by MRN, date of birth, ID band Patient awake    Reviewed: Allergy & Precautions, H&P , NPO status , Patient's Chart, lab work & pertinent test results, reviewed documented beta blocker date and time   History of Anesthesia Complications Negative for: history of anesthetic complications  Airway Mallampati: III  TM Distance: >3 FB Neck ROM: full    Dental no notable dental hx. (+) Caps, Missing   Pulmonary shortness of breath and with exertion, asthma , neg sleep apnea, neg COPD, neg recent URI,    Pulmonary exam normal breath sounds clear to auscultation       Cardiovascular Exercise Tolerance: Good hypertension, On Medications (-) angina(-) CAD, (-) Past MI, (-) Cardiac Stents and (-) CABG Normal cardiovascular exam+ dysrhythmias (-) Valvular Problems/Murmurs Rhythm:regular Rate:Normal     Neuro/Psych negative neurological ROS  negative psych ROS   GI/Hepatic negative GI ROS, Neg liver ROS,   Endo/Other  diabetes, Oral Hypoglycemic AgentsMorbid obesity  Renal/GU negative Renal ROS  negative genitourinary   Musculoskeletal   Abdominal   Peds  Hematology negative hematology ROS (+)   Anesthesia Other Findings Past Medical History:   Diabetes mellitus without complication ([place])                 Hypertension                                                 Asthma                                                       Diverticulosis                                               Hypercholesterolemia                                         Irregular heart rhythm                                         Comment:evaluated by Cardiologist at [facility] [place], [place], with stress test               and cardiac cath, awaiting records   Obese                                                        Spinal stenosis  Comment:neck and back   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   Dental Advisory Given  Plan Discussed with: Anesthesiologist, CRNA and Surgeon  Anesthesia Plan Comments:         Anesthesia Quick Evaluation

## 2015-11-25 NOTE — H&P (Signed)
Outpatient short stay form Pre-procedure 11/25/2015 2:13 PM Erin Sails MD  Primary Physician: Dr. Elijio Miles  Reason for visit:  EGD  History of present illness:  Patient is a 75 year old female presenting today for EGD. She was seen in the outpatient office with a complaint of chronic diarrhea and fecal incontinence. These are symptomatic with Citrucel and Imodium and low dose. However part of her evaluation included a celiac panel which returned with a markedly elevated tissue transglutaminase. Is raised a question of possible celiac sprue. She is presenting today for EGD.    Current facility-administered medications:  .  0.9 %  sodium chloride infusion, , Intravenous, Continuous, Erin Sails, MD  Prescriptions prior to admission  Medication Sig Dispense Refill Last Dose  . albuterol (PROVENTIL HFA;VENTOLIN HFA) 108 (90 BASE) MCG/ACT inhaler Inhale 1 puff into the lungs every 6 (six) hours as needed for wheezing or shortness of breath.    Past Week at Unknown time  . budesonide-formoterol (SYMBICORT) 160-4.5 MCG/ACT inhaler Inhale 2 puffs into the lungs 2 (two) times daily.   Past Week at Unknown time  . diphenoxylate-atropine (LOMOTIL) 2.5-0.025 MG tablet Take 1 tablet by mouth 4 (four) times daily as needed for diarrhea or loose stools. 30 tablet 1 Past Week at Unknown time  . esomeprazole (NEXIUM) 40 MG capsule Take 1 capsule (40 mg total) by mouth daily. 30 capsule 1 Past Week at Unknown time  . Esomeprazole Magnesium (NEXIUM 24HR PO) Take 22.3 mg by mouth daily. OTC Nexium   Past Week at Unknown time  . fexofenadine (ALLEGRA) 30 MG tablet Take 30 mg by mouth daily as needed (for allergies).    Past Week at Unknown time  . fluticasone (FLONASE) 50 MCG/ACT nasal spray Place 1 spray into both nostrils daily.   Past Week at Unknown time  . furosemide (LASIX) 40 MG tablet Take 1 tablet by mouth daily.   11/24/2015 at Unknown time  . LIVALO 2 MG TABS Take 2 mg by mouth daily.    Past Week at Unknown time  . Loperamide HCl (IMODIUM PO) Take 1 tablet by mouth daily as needed (for diarrhea).   11/24/2015 at Unknown time  . metFORMIN (GLUCOPHAGE) 1000 MG tablet Take 1,000 mg by mouth 2 (two) times daily with a meal.   11/24/2015 at Unknown time  . metoprolol (LOPRESSOR) 100 MG tablet Take 1 tablet by mouth 2 (two) times daily.   11/24/2015 at Unknown time  . montelukast (SINGULAIR) 10 MG tablet Take 10 mg by mouth at bedtime as needed (for allergies).    Past Week at Unknown time  . potassium chloride (K-DUR,KLOR-CON) 10 MEQ tablet Take 10 mEq by mouth 2 (two) times daily.   Past Week at Unknown time  . valsartan-hydrochlorothiazide (DIOVAN-HCT) 320-25 MG tablet Take 1 tablet by mouth daily.   11/24/2015 at Unknown time     Allergies  Allergen Reactions  . Ace Inhibitors Cough  . Iodine Other (See Comments)    Reactions: Eyes swell  . Latex   . Lotrel [Amlodipine Besy-Benazepril Hcl]   . Adhesive [Tape] Rash     Past Medical History  Diagnosis Date  . Diabetes mellitus without complication ([place])   . Hypertension   . Asthma   . Diverticulosis   . Hypercholesterolemia   . Irregular heart rhythm     evaluated by Cardiologist at [place], [place], with stress test and cardiac cath, awaiting records  . Obese   . Spinal  stenosis     neck and back  . 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    Review of systems:      Physical Exam    Heart and lungs: Regular rate and rhythm without rub or gallop, lungs are bilaterally clear.    HEENT: Normocephalic atraumatic eyes are anicteric    Other:     Pertinant exam for procedure: Soft nontender nondistended bowel sounds positive normoactive. I have discussed the risks benefits and complications of procedures to include not limited to bleeding, infection, perforation and the risk of sedation and the patient wishes to proceed.    Planned proceedures: EGD and indicated procedures. I have  discussed the risks benefits and complications of procedures to include not limited to bleeding, infection, perforation and the risk of sedation and the patient wishes to proceed.    Erin Sails, MD Gastroenterology 11/25/2015  2:13 PM

## 2015-11-26 ENCOUNTER — Encounter: Payer: Self-pay | Admitting: Gastroenterology

## 2015-12-02 LAB — SURGICAL PATHOLOGY

## 2016-03-05 ENCOUNTER — Other Ambulatory Visit: Payer: Self-pay | Admitting: Internal Medicine

## 2016-03-05 DIAGNOSIS — Z1231 Encounter for screening mammogram for malignant neoplasm of breast: Secondary | ICD-10-CM

## 2016-04-16 ENCOUNTER — Ambulatory Visit
Admission: RE | Admit: 2016-04-16 | Discharge: 2016-04-16 | Disposition: A | Discharge status: Home / Self Care | Payer: Medicare Other | Source: Ambulatory Visit | Attending: Internal Medicine | Admitting: Internal Medicine

## 2016-04-16 ENCOUNTER — Other Ambulatory Visit: Payer: Self-pay | Admitting: Internal Medicine

## 2016-04-16 DIAGNOSIS — Z1231 Encounter for screening mammogram for malignant neoplasm of breast: Secondary | ICD-10-CM

## 2016-10-17 ENCOUNTER — Ambulatory Visit
Admission: EM | Admit: 2016-10-17 | Discharge: 2016-10-17 | Disposition: A | Discharge status: Home / Self Care | Payer: Medicare Other | Attending: Internal Medicine | Admitting: Internal Medicine

## 2016-10-17 DIAGNOSIS — I10 Essential (primary) hypertension: Secondary | ICD-10-CM | POA: Diagnosis not present

## 2016-10-17 MED ORDER — HYDRALAZINE HCL 10 MG PO TABS: 10.0000 mg | ORAL_TABLET | Freq: Two times a day (BID) | ORAL | 1 refills | Status: DC | PRN

## 2016-10-17 NOTE — ED Triage Notes (Signed)
Pt reports hx of hypertension and does daily b/p checks. She reported feeling her heartbeat in her ears this a.m. On wakening so she checked her b/p and it was high (200/100).

## 2016-10-17 NOTE — ED Provider Notes (Signed)
[facility]    CSN: [id number] Arrival date & time: 10/17/16  1357     History   Chief Complaint Chief Complaint  Patient presents with  . Hypertension    HPI Erin Mcintosh is a 76 y.o. female.   Pt concerned about BP.  SBP>200 this morning.  Denies chest pain or SOB.  Admits to intermittent left arm pain but this is chronic and not specifically assoc w/ elevated BP, although it was present this morning.  She reports taking her medications as directed.      Past Medical History:  Diagnosis Date  . Asthma   . Diabetes mellitus without complication ([place])   . Diverticulosis   . Hypercholesterolemia   . Hypertension   . Irregular heart rhythm    evaluated by Cardiologist at [place], [place], with stress test and cardiac cath, awaiting records  . Obese   . Spinal stenosis    neck and back  . 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    There are no active problems to display for this patient.   Past Surgical History:  Procedure Laterality Date  . ABDOMINAL HYSTERECTOMY    . APPENDECTOMY    . BACK SURGERY    . BREAST BIOPSY Right    negative 1970's  . CARDIAC CATHETERIZATION    . COLONOSCOPY    . ESOPHAGOGASTRODUODENOSCOPY (EGD) WITH PROPOFOL N/A 11/25/2015   Procedure: ESOPHAGOGASTRODUODENOSCOPY (EGD) WITH PROPOFOL;  Surgeon: Lollie Sails, MD;  [facility] END[facility]COPY;  Service: Endoscopy;  Laterality: N/A;  . EXPLORATION OF SPINAL FUSION    . NECK SURGERY    . TUBAL LIGATION      OB History    No data available       Home Medications    Prior to Admission medications   Medication Sig Start Date End Date Taking? Authorizing Provider  albuterol (PROVENTIL HFA;VENTOLIN HFA) 108 (90 BASE) MCG/ACT inhaler Inhale 1 puff into the lungs every 6 (six) hours as needed for wheezing or shortness of breath.     Historical Provider, MD  budesonide-formoterol (SYMBICORT) 160-4.5 MCG/ACT inhaler Inhale 2 puffs into the  lungs 2 (two) times daily.    Historical Provider, MD  esomeprazole (NEXIUM) 40 MG capsule Take 1 capsule (40 mg total) by mouth daily. 08/21/15 08/20/16  Earleen Newport, MD  Esomeprazole Magnesium (NEXIUM 24HR PO) Take 22.3 mg by mouth daily. OTC Nexium    Historical Provider, MD  fexofenadine (ALLEGRA) 30 MG tablet Take 30 mg by mouth daily as needed (for allergies).     Historical Provider, MD  fluticasone (FLONASE) 50 MCG/ACT nasal spray Place 1 spray into both nostrils daily.    Historical Provider, MD  furosemide (LASIX) 40 MG tablet Take 1 tablet by mouth daily. 05/16/15   Historical Provider, MD  hydrALAZINE (APRESOLINE) 10 MG tablet Take 1 tablet (10 mg total) by mouth 2 (two) times daily as needed (elevated blood pressure). 10/17/16   Harrie Foreman, MD  LIVALO 2 MG TABS Take 2 mg by mouth daily. 08/13/15   Historical Provider, MD  Loperamide HCl (IMODIUM PO) Take 1 tablet by mouth daily as needed (for diarrhea).    Historical Provider, MD  metFORMIN (GLUCOPHAGE) 1000 MG tablet Take 1,000 mg by mouth daily with breakfast.     Historical Provider, MD  metoprolol (LOPRESSOR) 100 MG tablet Take 1 tablet by mouth 2 (two) times daily. 07/18/15   Historical Provider, MD  montelukast (SINGULAIR) 10 MG tablet  Take 10 mg by mouth at bedtime as needed (for allergies).     Historical Provider, MD  potassium chloride (K-DUR,KLOR-CON) 10 MEQ tablet Take 10 mEq by mouth 2 (two) times daily.    Historical Provider, MD  valsartan-hydrochlorothiazide (DIOVAN-HCT) 320-25 MG tablet Take 1 tablet by mouth daily. 05/16/15   Historical Provider, MD    Family History Family History  Problem Relation Age of Onset  . Breast cancer Neg Hx     Social History Social History  Substance Use Topics  . Smoking status: Never Smoker  . Smokeless tobacco: Never Used  . Alcohol use Yes     Allergies   Ace inhibitors; Iodine; Latex; Lotrel [amlodipine besy-benazepril hcl]; and Adhesive [tape]   Review of  Systems Review of Systems  Constitutional: Negative for chills and fever.  HENT: Negative for sore throat and tinnitus.   Eyes: Negative for redness.  Respiratory: Negative for cough and shortness of breath.   Cardiovascular: Positive for palpitations (occasional (not today)). Negative for chest pain.  Gastrointestinal: Negative for abdominal pain, diarrhea, nausea and vomiting.  Genitourinary: Negative for dysuria, frequency and urgency.  Musculoskeletal: Negative for myalgias.  Skin: Negative for rash.       No lesions  Neurological: Negative for weakness.  Hematological: Does not bruise/bleed easily.  Psychiatric/Behavioral: Negative for suicidal ideas.     Physical Exam Triage Vital Signs ED Triage Vitals  Enc Vitals Group     BP 10/17/16 1410 (!) 189/74     Pulse Rate 10/17/16 1410 (!) 59     Resp 10/17/16 1410 20     Temp 10/17/16 1410 97.9 F (36.6 C)     Temp Source 10/17/16 1410 Oral     SpO2 10/17/16 1410 98 %     Weight 10/17/16 1412 210 lb (95.3 kg)     Height 10/17/16 1412 5\' 2"  (1.575 m)     Head Circumference --      Peak Flow --      Pain Score --      Pain Loc --      Pain Edu? --      Excl. in [place]? --    No data found.   Updated Vital Signs BP (!) 189/74 (BP Location: Left Arm)   Pulse (!) 59   Temp 97.9 F (36.6 C) (Oral)   Resp 20   Ht 5\' 2"  (1.575 m)   Wt 210 lb (95.3 kg)   SpO2 98%   BMI 38.41 kg/m   Visual Acuity Right Eye Distance:   Left Eye Distance:   Bilateral Distance:    Right Eye Near:   Left Eye Near:    Bilateral Near:     Physical Exam  Constitutional: She is oriented to person, place, and time. She appears well-developed and well-nourished. No distress.  HENT:  Head: Normocephalic and atraumatic.  Mouth/Throat: Oropharynx is clear and moist.  Eyes: Conjunctivae and EOM are normal. Pupils are equal, round, and reactive to light. No scleral icterus.  Neck: Normal range of motion. Neck supple. No JVD present. No  tracheal deviation present. No thyromegaly present.  Cardiovascular: Normal rate, regular rhythm and normal heart sounds.  Exam reveals no gallop and no friction rub.   No murmur heard. Pulmonary/Chest: Effort normal and breath sounds normal.  Abdominal: Soft. Bowel sounds are normal. She exhibits no distension. There is no tenderness.  Musculoskeletal: Normal range of motion. She exhibits edema (trace, RLE).  Lymphadenopathy:    She has no  cervical adenopathy.  Neurological: She is alert and oriented to person, place, and time. No cranial nerve deficit.  Skin: Skin is warm and dry.  Psychiatric: She has a normal mood and affect. Her behavior is normal. Judgment and thought content normal.  Nursing note and vitals reviewed.    UC Treatments / Results  Labs (all labs ordered are listed, but only abnormal results are displayed) Labs Reviewed - No data to display  EKG  EKG Interpretation None       Radiology No results found.  Procedures Procedures (including critical care time)  Medications Ordered in UC Medications - No data to display   Initial Impression / Assessment and Plan / UC Course  I have reviewed the triage vital signs and the nursing notes.  Pertinent labs & imaging results that were available during my care of the patient were reviewed by me and considered in my medical decision making (see chart for details).     No angina.  Arm pain does not seem like an anginal equivalent. I have added hydralazine prn (particularly for morning).  Patient will check BP twice daily until can arrange follow up with her PMD.  She also plans to see her cardiologist.    Final Clinical Impressions(s) / UC Diagnoses   Final diagnoses:  Essential hypertension    New Prescriptions Discharge Medication List as of 10/17/2016  3:08 PM    START taking these medications   Details  hydrALAZINE (APRESOLINE) 10 MG tablet Take 1 tablet (10 mg total) by mouth 2 (two) times daily as  needed (elevated blood pressure)., Starting Sun 10/17/2016, Normal         Harrie Foreman, MD 10/17/16 [phone]

## 2016-10-21 ENCOUNTER — Telehealth: Payer: Self-pay

## 2016-10-21 NOTE — Telephone Encounter (Signed)
Courtesy call back completed today after patient's visit at [facility]. Patient improved and will call back with any questions or concerns.  

## 2017-03-30 ENCOUNTER — Other Ambulatory Visit: Payer: Self-pay | Admitting: Obstetrics and Gynecology

## 2017-03-30 DIAGNOSIS — Z1231 Encounter for screening mammogram for malignant neoplasm of breast: Secondary | ICD-10-CM

## 2017-04-20 ENCOUNTER — Ambulatory Visit
Admission: RE | Admit: 2017-04-20 | Discharge: 2017-04-20 | Disposition: A | Discharge status: Home / Self Care | Payer: Medicare Other | Source: Ambulatory Visit | Attending: Obstetrics and Gynecology | Admitting: Obstetrics and Gynecology

## 2017-04-20 DIAGNOSIS — Z1231 Encounter for screening mammogram for malignant neoplasm of breast: Secondary | ICD-10-CM | POA: Diagnosis not present

## 2017-09-29 ENCOUNTER — Other Ambulatory Visit: Payer: Self-pay | Admitting: Internal Medicine

## 2017-09-29 DIAGNOSIS — M7989 Other specified soft tissue disorders: Secondary | ICD-10-CM

## 2017-09-29 DIAGNOSIS — R609 Edema, unspecified: Secondary | ICD-10-CM

## 2017-10-05 ENCOUNTER — Ambulatory Visit
Admission: RE | Admit: 2017-10-05 | Discharge: 2017-10-05 | Disposition: A | Discharge status: Home / Self Care | Payer: Medicare Other | Source: Ambulatory Visit | Attending: Internal Medicine | Admitting: Internal Medicine

## 2017-10-05 DIAGNOSIS — M7989 Other specified soft tissue disorders: Secondary | ICD-10-CM

## 2017-10-05 DIAGNOSIS — R609 Edema, unspecified: Secondary | ICD-10-CM

## 2017-10-05 DIAGNOSIS — I82409 Acute embolism and thrombosis of unspecified deep veins of unspecified lower extremity: Secondary | ICD-10-CM | POA: Diagnosis present

## 2018-02-20 ENCOUNTER — Ambulatory Visit: Payer: Medicare Other | Admitting: Certified Registered Nurse Anesthetist

## 2018-02-20 ENCOUNTER — Encounter: Payer: Self-pay | Admitting: Certified Registered Nurse Anesthetist

## 2018-02-20 ENCOUNTER — Ambulatory Visit
Admission: RE | Admit: 2018-02-20 | Discharge: 2018-02-20 | Disposition: A | Discharge status: Home / Self Care | Payer: Medicare Other | Source: Ambulatory Visit | Attending: Gastroenterology | Admitting: Gastroenterology

## 2018-02-20 ENCOUNTER — Encounter
Admission: RE | Disposition: A | Discharge status: Home / Self Care | Payer: Self-pay | Source: Ambulatory Visit | Attending: Gastroenterology

## 2018-02-20 DIAGNOSIS — E7800 Pure hypercholesterolemia, unspecified: Secondary | ICD-10-CM | POA: Insufficient documentation

## 2018-02-20 DIAGNOSIS — K219 Gastro-esophageal reflux disease without esophagitis: Secondary | ICD-10-CM | POA: Diagnosis not present

## 2018-02-20 DIAGNOSIS — I10 Essential (primary) hypertension: Secondary | ICD-10-CM | POA: Diagnosis not present

## 2018-02-20 DIAGNOSIS — Z888 Allergy status to other drugs, medicaments and biological substances status: Secondary | ICD-10-CM | POA: Diagnosis not present

## 2018-02-20 DIAGNOSIS — Z91040 Latex allergy status: Secondary | ICD-10-CM | POA: Diagnosis not present

## 2018-02-20 DIAGNOSIS — Z8719 Personal history of other diseases of the digestive system: Secondary | ICD-10-CM | POA: Diagnosis not present

## 2018-02-20 DIAGNOSIS — K635 Polyp of colon: Secondary | ICD-10-CM | POA: Diagnosis not present

## 2018-02-20 DIAGNOSIS — Z6837 Body mass index (BMI) 37.0-37.9, adult: Secondary | ICD-10-CM | POA: Insufficient documentation

## 2018-02-20 DIAGNOSIS — E669 Obesity, unspecified: Secondary | ICD-10-CM | POA: Insufficient documentation

## 2018-02-20 DIAGNOSIS — Z86010 Personal history of colonic polyps: Secondary | ICD-10-CM | POA: Insufficient documentation

## 2018-02-20 DIAGNOSIS — Z1211 Encounter for screening for malignant neoplasm of colon: Secondary | ICD-10-CM | POA: Diagnosis not present

## 2018-02-20 DIAGNOSIS — K580 Irritable bowel syndrome with diarrhea: Secondary | ICD-10-CM | POA: Insufficient documentation

## 2018-02-20 DIAGNOSIS — Z7984 Long term (current) use of oral hypoglycemic drugs: Secondary | ICD-10-CM | POA: Diagnosis not present

## 2018-02-20 DIAGNOSIS — K5730 Diverticulosis of large intestine without perforation or abscess without bleeding: Secondary | ICD-10-CM | POA: Insufficient documentation

## 2018-02-20 DIAGNOSIS — Z79899 Other long term (current) drug therapy: Secondary | ICD-10-CM | POA: Insufficient documentation

## 2018-02-20 DIAGNOSIS — I499 Cardiac arrhythmia, unspecified: Secondary | ICD-10-CM | POA: Insufficient documentation

## 2018-02-20 DIAGNOSIS — E119 Type 2 diabetes mellitus without complications: Secondary | ICD-10-CM | POA: Diagnosis not present

## 2018-02-20 DIAGNOSIS — J45909 Unspecified asthma, uncomplicated: Secondary | ICD-10-CM | POA: Diagnosis not present

## 2018-02-20 DIAGNOSIS — Z800 Family history of malignant neoplasm of digestive organs: Secondary | ICD-10-CM | POA: Insufficient documentation

## 2018-02-20 HISTORY — PX: COLONOSCOPY WITH PROPOFOL: SHX5780

## 2018-02-20 LAB — GLUCOSE, CAPILLARY: GLUCOSE-CAPILLARY: 90 mg/dL (ref 70–99)

## 2018-02-20 SURGERY — COLONOSCOPY WITH PROPOFOL
Anesthesia: General

## 2018-02-20 MED ORDER — PROPOFOL 500 MG/50ML IV EMUL
INTRAVENOUS | Status: DC | PRN
  Administered 2018-02-20: 125 ug/kg/min via INTRAVENOUS

## 2018-02-20 MED ORDER — PHENYLEPHRINE HCL 10 MG/ML IJ SOLN
INTRAMUSCULAR | Status: DC | PRN
  Administered 2018-02-20: 100 ug via INTRAVENOUS

## 2018-02-20 MED ORDER — PROPOFOL 10 MG/ML IV BOLUS
INTRAVENOUS | Status: DC | PRN
  Administered 2018-02-20: 50 mg via INTRAVENOUS

## 2018-02-20 MED ORDER — SODIUM CHLORIDE 0.9 % IV SOLN
INTRAVENOUS | Status: DC
  Administered 2018-02-20: 13:00:00 via INTRAVENOUS
  Administered 2018-02-20: 1000 mL via INTRAVENOUS

## 2018-02-20 MED ORDER — LIDOCAINE HCL (CARDIAC) PF 100 MG/5ML IV SOSY
PREFILLED_SYRINGE | INTRAVENOUS | Status: DC | PRN
  Administered 2018-02-20: 50 mg via INTRAVENOUS

## 2018-02-20 MED ORDER — LIDOCAINE HCL (PF) 1 % IJ SOLN
INTRAMUSCULAR | Status: AC
  Administered 2018-02-20: 0.3 mL via INTRADERMAL
  Filled 2018-02-20: qty 2

## 2018-02-20 MED ORDER — LIDOCAINE HCL (PF) 1 % IJ SOLN
2.0000 mL | Freq: Once | INTRAMUSCULAR | Status: AC
  Administered 2018-02-20: 0.3 mL via INTRADERMAL

## 2018-02-20 NOTE — Transfer of Care (Signed)
Immediate Anesthesia Transfer of Care Note  Patient: Erin Mcintosh  Procedure(s) Performed: COLONOSCOPY WITH PROPOFOL (N/A )  Patient Location: PACU  Anesthesia Type:General  Level of Consciousness: awake, alert  and oriented  Airway & Oxygen Therapy: Patient Spontanous Breathing and Patient connected to nasal cannula oxygen  Post-op Assessment: Report given to RN and Post -op Vital signs reviewed and stable  Post vital signs: stable  Last Vitals:  Vitals Value Taken Time  BP    Temp    Pulse    Resp    SpO2      Last Pain:  Vitals:   02/20/18 1159  TempSrc: Tympanic  PainSc: 0-No pain         Complications: No apparent anesthesia complications

## 2018-02-20 NOTE — Anesthesia Preprocedure Evaluation (Signed)
Anesthesia Evaluation  Patient identified by MRN, date of birth, ID band Patient awake    Reviewed: Allergy & Precautions, H&P , NPO status , Patient's Chart, lab work & pertinent test results, reviewed documented beta blocker date and time   Airway Mallampati: II  TM Distance: >3 FB Neck ROM: full    Dental  (+) Caps, Dental Advidsory Given, Missing, Teeth Intact   Pulmonary neg shortness of breath, asthma , neg sleep apnea, neg COPD, neg recent URI,           Cardiovascular Exercise Tolerance: Good hypertension, (-) angina(-) CAD, (-) Past MI, (-) Cardiac Stents and (-) CABG + dysrhythmias (-) Valvular Problems/Murmurs     Neuro/Psych negative neurological ROS  negative psych ROS   GI/Hepatic Neg liver ROS, GERD  ,  Endo/Other  diabetes  Renal/GU negative Renal ROS  negative genitourinary   Musculoskeletal   Abdominal   Peds  Hematology negative hematology ROS (+)   Anesthesia Other Findings Past Medical History: No date: Asthma No date: Diabetes mellitus without complication ([facility]) No date: Diverticulosis No date: Hypercholesterolemia No date: Hypertension No date: Irregular heart rhythm     Comment:  evaluated by Cardiologist at [place], [place], with stress test and cardiac cath,               awaiting records No date: Obese No date: Spinal stenosis     Comment:  neck and back 10/31/2014: Tubulovillous adenoma of colon   Reproductive/Obstetrics negative OB ROS                             Anesthesia Physical Anesthesia Plan  ASA: III  Anesthesia Plan: General   Post-op Pain Management:    Induction: Intravenous  PONV Risk Score and Plan: 3 and Propofol infusion and TIVA  Airway Management Planned: Nasal Cannula  Additional Equipment:   Intra-op Plan:   Post-operative Plan:   Informed Consent: I have reviewed the patients  History and Physical, chart, labs and discussed the procedure including the risks, benefits and alternatives for the proposed anesthesia with the patient or authorized representative who has indicated his/her understanding and acceptance.   Dental Advisory Given  Plan Discussed with: Anesthesiologist, CRNA and Surgeon  Anesthesia Plan Comments:         Anesthesia Quick Evaluation

## 2018-02-20 NOTE — Op Note (Signed)
[facility] Gastroenterology Patient Name: Erin Mcintosh Procedure Date: 02/20/2018 12:12 PM MRN: [id number] Account #: [id number] Date of Birth: [birth year] Admit Type: Outpatient Age: 77 Room: [facility] ENDO ROOM 1 Gender: Female Note Status: Finalized Procedure:            Colonoscopy Indications:          Family history of colon cancer in a first-degree                        relative, Personal history of colonic polyps Providers:            Lollie Sails, MD Referring MD:         Venetia Maxon. Elijio Miles, MD (Referring MD) Medicines:            Monitored Anesthesia Care Complications:        No immediate complications. Procedure:            Pre-Anesthesia Assessment:                       - ASA Grade Assessment: III - A patient with severe                        systemic disease.                       After obtaining informed consent, the colonoscope was                        passed under direct vision. Throughout the procedure,                        the patient's blood pressure, pulse, and oxygen                        saturations were monitored continuously. The                        Colonoscope was introduced through the anus and                        advanced to the the cecum, identified by appendiceal                        orifice and ileocecal valve. The colonoscopy was                        performed without difficulty. The patient tolerated the                        procedure well. The quality of the bowel preparation                        was good. Findings:      A few small-mouthed diverticula were found in the sigmoid colon and       descending colon.      A 3 mm polyp was found in the transverse colon. The polyp was sessile.       The polyp was removed with a cold biopsy forceps. Resection and       retrieval were complete.      Biopsies for histology were  taken with a cold forceps from the right       colon and left colon for evaluation of  microscopic colitis.      The retroflexed view of the distal rectum and anal verge was normal and       showed no anal or rectal abnormalities.      external skin tags      The exam was otherwise normal throughout the examined colon. Impression:           - Diverticulosis in the sigmoid colon and in the                        descending colon.                       - One 3 mm polyp in the transverse colon, removed with                        a cold biopsy forceps. Resected and retrieved.                       - The distal rectum and anal verge are normal on                        retroflexion view.                       - Biopsies were taken with a cold forceps from the                        right colon and left colon for evaluation of                        microscopic colitis. Recommendation:       - Repeat colonoscopy in 5 years for adenoma                        surveillance if desired.                       - Telephone GI clinic for pathology results in 1 week. Procedure Code(s):    --- Professional ---                       [phone], Colonoscopy, flexible; with biopsy, single or                        multiple Diagnosis Code(s):    --- Professional ---                       D12.3, Benign neoplasm of transverse colon (hepatic                        flexure or splenic flexure)                       Z80.0, Family history of malignant neoplasm of                        digestive organs  Z86.010, Personal history of colonic polyps                       K57.30, Diverticulosis of large intestine without                        perforation or abscess without bleeding CPT copyright 2017 American Medical Association. All rights reserved. The codes documented in this report are preliminary and upon coder review may  be revised to meet current compliance requirements. Lollie Sails, MD 02/20/2018 1:08:05 PM This report has been signed electronically. Number of Addenda:  0 Note Initiated On: 02/20/2018 12:12 PM Scope Withdrawal Time: 0 hours 14 minutes 12 seconds  Total Procedure Duration: 0 hours 19 minutes 6 seconds       [facility]

## 2018-02-20 NOTE — H&P (Signed)
Outpatient short stay form Pre-procedure 02/20/2018 12:16 PM Lollie Sails MD  Primary Physician: Dr Pernell Dupre  Reason for visit: Colonoscopy  History of present illness: Patient is a 77 year old female with a personal history of adenomatous colon polyps and family history of colon cancer primary relative, mother.  She also has a history of IBS D and diarrhea.  Tolerating her prep well.  He takes no aspirin products or blood thinning agents.    Current Facility-Administered Medications:  .  0.9 %  sodium chloride infusion, , Intravenous, Continuous, Lollie Sails, MD .  lidocaine (PF) (XYLOCAINE) 1 % injection 2 mL, 2 mL, Intradermal, Once, Lollie Sails, MD .  lidocaine (PF) (XYLOCAINE) 1 % injection, , , ,   Medications Prior to Admission  Medication Sig Dispense Refill Last Dose  . budesonide-formoterol (SYMBICORT) 80-4.5 MCG/ACT inhaler Inhale 1 puff into the lungs 2 (two) times daily.     . furosemide (LASIX) 20 MG tablet Take 20 mg by mouth.     . hydrALAZINE (APRESOLINE) 25 MG tablet Take 25 mg by mouth 3 (three) times daily.     . metformin (FORTAMET) 500 MG (OSM) 24 hr tablet Take 500 mg by mouth daily with breakfast.     . methylcellulose oral powder Take 1 packet by mouth daily. 19gm powder mixed with 249mL fluid     . albuterol (PROVENTIL HFA;VENTOLIN HFA) 108 (90 BASE) MCG/ACT inhaler Inhale 1 puff into the lungs every 6 (six) hours as needed for wheezing or shortness of breath.    Past Week at Unknown time  . budesonide-formoterol (SYMBICORT) 160-4.5 MCG/ACT inhaler Inhale 2 puffs into the lungs 2 (two) times daily.   Past Week at Unknown time  . esomeprazole (NEXIUM) 40 MG capsule Take 1 capsule (40 mg total) by mouth daily. 30 capsule 1 Past Week at Unknown time  . Esomeprazole Magnesium (NEXIUM 24HR PO) Take 22.3 mg by mouth daily. OTC Nexium   Past Week at Unknown time  . fexofenadine (ALLEGRA) 30 MG tablet Take 30 mg by mouth daily as needed (for  allergies).    Past Week at Unknown time  . fluticasone (FLONASE) 50 MCG/ACT nasal spray Place 1 spray into both nostrils daily.   Past Week at Unknown time  . furosemide (LASIX) 40 MG tablet Take 1 tablet by mouth daily.   11/24/2015 at Unknown time  . hydrALAZINE (APRESOLINE) 10 MG tablet Take 1 tablet (10 mg total) by mouth 2 (two) times daily as needed (elevated blood pressure). 60 tablet 1   . LIVALO 2 MG TABS Take 2 mg by mouth daily.   Past Week at Unknown time  . Loperamide HCl (IMODIUM PO) Take 1 tablet by mouth daily as needed (for diarrhea).   11/24/2015 at Unknown time  . metFORMIN (GLUCOPHAGE) 1000 MG tablet Take 1,000 mg by mouth daily with breakfast.    11/24/2015 at Unknown time  . metoprolol (LOPRESSOR) 100 MG tablet Take 1 tablet by mouth 2 (two) times daily.   11/24/2015 at Unknown time  . montelukast (SINGULAIR) 10 MG tablet Take 10 mg by mouth at bedtime as needed (for allergies).    Past Week at Unknown time  . potassium chloride (K-DUR,KLOR-CON) 10 MEQ tablet Take 10 mEq by mouth 2 (two) times daily.   Past Week at Unknown time  . valsartan-hydrochlorothiazide (DIOVAN-HCT) 320-25 MG tablet Take 1 tablet by mouth daily.   11/24/2015 at Unknown time     Allergies  Allergen Reactions  .  Ace Inhibitors Cough  . Iodine Other (See Comments)    Reactions: Eyes swell  . Latex   . Lotrel [Amlodipine Besy-Benazepril Hcl]   . Adhesive [Tape] Rash     Past Medical History:  Diagnosis Date  . Asthma   . Diabetes mellitus without complication ([place])   . Diverticulosis   . Hypercholesterolemia   . Hypertension   . Irregular heart rhythm    evaluated by Cardiologist at [place], [place], with stress test and cardiac cath, awaiting records  . Obese   . Spinal stenosis    neck and back  . 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    HEENT: Normocephalic atraumatic eyes are anicteric    Other:    Pertinant exam for procedure: Soft nontender nondistended bowel sounds positive normoactive.    Planned proceedures: Colonoscopy and indicated procedures. I have discussed the risks benefits and complications of procedures to include not limited to bleeding, infection, perforation and the risk of sedation and the patient wishes to proceed.    Lollie Sails, MD Gastroenterology 02/20/2018  12:16 PM

## 2018-02-20 NOTE — Anesthesia Post-op Follow-up Note (Signed)
Anesthesia QCDR form completed.        

## 2018-02-20 NOTE — Anesthesia Postprocedure Evaluation (Signed)
Anesthesia Post Note  Patient: Erin Mcintosh  Procedure(s) Performed: COLONOSCOPY WITH PROPOFOL (N/A )  Patient location during evaluation: Endoscopy Anesthesia Type: General Level of consciousness: awake and alert Pain management: pain level controlled Vital Signs Assessment: post-procedure vital signs reviewed and stable Respiratory status: spontaneous breathing, nonlabored ventilation, respiratory function stable and patient connected to nasal cannula oxygen Cardiovascular status: blood pressure returned to baseline and stable Postop Assessment: no apparent nausea or vomiting Anesthetic complications: no     Last Vitals:  Vitals:   02/20/18 1325 02/20/18 1335  BP: 117/67 (!) 121/59  Pulse: (!) 56 (!) 53  Resp: 14 18  Temp:    SpO2: 100% 98%    Last Pain:  Vitals:   02/20/18 1335  TempSrc:   PainSc: 0-No pain                 Martha Clan

## 2018-02-21 ENCOUNTER — Encounter: Payer: Self-pay | Admitting: Gastroenterology

## 2018-02-21 LAB — SURGICAL PATHOLOGY

## 2018-03-15 ENCOUNTER — Other Ambulatory Visit: Payer: Self-pay | Admitting: Internal Medicine

## 2018-03-15 DIAGNOSIS — Z1231 Encounter for screening mammogram for malignant neoplasm of breast: Secondary | ICD-10-CM

## 2018-04-14 ENCOUNTER — Other Ambulatory Visit: Payer: Self-pay

## 2018-04-14 ENCOUNTER — Ambulatory Visit
Admission: EM | Admit: 2018-04-14 | Discharge: 2018-04-14 | Disposition: A | Discharge status: Home / Self Care | Payer: Medicare Other | Attending: Family Medicine | Admitting: Family Medicine

## 2018-04-14 ENCOUNTER — Ambulatory Visit
Admission: RE | Admit: 2018-04-14 | Discharge: 2018-04-14 | Disposition: A | Discharge status: Home / Self Care | Payer: Medicare Other | Source: Ambulatory Visit | Attending: Family Medicine | Admitting: Family Medicine

## 2018-04-14 DIAGNOSIS — K8020 Calculus of gallbladder without cholecystitis without obstruction: Secondary | ICD-10-CM | POA: Insufficient documentation

## 2018-04-14 DIAGNOSIS — R1013 Epigastric pain: Secondary | ICD-10-CM

## 2018-04-14 DIAGNOSIS — K7689 Other specified diseases of liver: Secondary | ICD-10-CM | POA: Diagnosis not present

## 2018-04-14 DIAGNOSIS — R1011 Right upper quadrant pain: Secondary | ICD-10-CM | POA: Diagnosis present

## 2018-04-14 LAB — CBC WITH DIFFERENTIAL/PLATELET
BASOS PCT: 1 %
Basophils Absolute: 0.1 10*3/uL (ref 0–0.1)
EOS ABS: 0.3 10*3/uL (ref 0–0.7)
Eosinophils Relative: 5 %
HCT: 40.8 % (ref 35.0–47.0)
Hemoglobin: 13.2 g/dL (ref 12.0–16.0)
Lymphocytes Relative: 22 %
Lymphs Abs: 1.5 10*3/uL (ref 1.0–3.6)
MCH: 29.2 pg (ref 26.0–34.0)
MCHC: 32.4 g/dL (ref 32.0–36.0)
MCV: 90 fL (ref 80.0–100.0)
MONO ABS: 0.4 10*3/uL (ref 0.2–0.9)
MONOS PCT: 6 %
Neutro Abs: 4.5 10*3/uL (ref 1.4–6.5)
Neutrophils Relative %: 66 %
Platelets: 268 10*3/uL (ref 150–440)
RBC: 4.54 MIL/uL (ref 3.80–5.20)
RDW: 14.7 % — AB (ref 11.5–14.5)
WBC: 6.9 10*3/uL (ref 3.6–11.0)

## 2018-04-14 LAB — COMPREHENSIVE METABOLIC PANEL
ALBUMIN: 3.9 g/dL (ref 3.5–5.0)
ALT: 347 U/L — ABNORMAL HIGH (ref 0–44)
ANION GAP: 12 (ref 5–15)
AST: 215 U/L — ABNORMAL HIGH (ref 15–41)
Alkaline Phosphatase: 174 U/L — ABNORMAL HIGH (ref 38–126)
BILIRUBIN TOTAL: 1.2 mg/dL (ref 0.3–1.2)
BUN: 14 mg/dL (ref 8–23)
CO2: 27 mmol/L (ref 22–32)
Calcium: 10.4 mg/dL — ABNORMAL HIGH (ref 8.9–10.3)
Chloride: 99 mmol/L (ref 98–111)
Creatinine, Ser: 0.94 mg/dL (ref 0.44–1.00)
GFR calc Af Amer: >60 mL/min (ref >60)
GFR calc non Af Amer: 57 mL/min — ABNORMAL LOW (ref >60)
GLUCOSE: 130 mg/dL — AB (ref 70–99)
POTASSIUM: 3.1 mmol/L — AB (ref 3.5–5.1)
SODIUM: 138 mmol/L (ref 135–145)
Total Protein: 7.8 g/dL (ref 6.5–8.1)

## 2018-04-14 LAB — LIPASE, BLOOD: Lipase: 26 U/L (ref 11–51)

## 2018-04-14 NOTE — Discharge Instructions (Signed)
Further instructions after ultrasound today Follow up with primary care provider for recheck blood tests

## 2018-04-14 NOTE — ED Triage Notes (Signed)
Patient complains of right upper quadrant abdominal pain that is worse after eating x 1 week.

## 2018-04-18 ENCOUNTER — Other Ambulatory Visit: Payer: Self-pay | Admitting: Gastroenterology

## 2018-04-18 DIAGNOSIS — K7689 Other specified diseases of liver: Secondary | ICD-10-CM

## 2018-04-21 ENCOUNTER — Ambulatory Visit
Admission: RE | Admit: 2018-04-21 | Discharge: 2018-04-21 | Disposition: A | Discharge status: Home / Self Care | Payer: Medicare Other | Source: Ambulatory Visit | Attending: Internal Medicine | Admitting: Internal Medicine

## 2018-04-21 DIAGNOSIS — Z1231 Encounter for screening mammogram for malignant neoplasm of breast: Secondary | ICD-10-CM | POA: Insufficient documentation

## 2018-04-26 ENCOUNTER — Ambulatory Visit: Payer: Self-pay | Admitting: Surgery

## 2018-05-09 ENCOUNTER — Ambulatory Visit
Admission: RE | Admit: 2018-05-09 | Discharge: 2018-05-09 | Disposition: A | Discharge status: Home / Self Care | Payer: Medicare Other | Source: Ambulatory Visit | Attending: Gastroenterology | Admitting: Gastroenterology

## 2018-05-09 DIAGNOSIS — K8020 Calculus of gallbladder without cholecystitis without obstruction: Secondary | ICD-10-CM | POA: Diagnosis not present

## 2018-05-09 DIAGNOSIS — I517 Cardiomegaly: Secondary | ICD-10-CM | POA: Insufficient documentation

## 2018-05-09 DIAGNOSIS — K7689 Other specified diseases of liver: Secondary | ICD-10-CM | POA: Diagnosis present

## 2018-05-09 MED ORDER — GADOBENATE DIMEGLUMINE 529 MG/ML IV SOLN
20.0000 mL | Freq: Once | INTRAVENOUS | Status: AC | PRN
  Administered 2018-05-09: 20 mL via INTRAVENOUS

## 2018-05-25 ENCOUNTER — Other Ambulatory Visit: Payer: Self-pay | Admitting: Gastroenterology

## 2018-05-25 DIAGNOSIS — R160 Hepatomegaly, not elsewhere classified: Secondary | ICD-10-CM

## 2018-06-12 ENCOUNTER — Ambulatory Visit: Payer: Medicare Other

## 2018-07-12 ENCOUNTER — Encounter: Payer: Self-pay | Admitting: *Deleted

## 2018-07-26 ENCOUNTER — Ambulatory Visit
Admission: RE | Admit: 2018-07-26 | Discharge: 2018-07-26 | Disposition: A | Discharge status: Home / Self Care | Payer: Medicare Other | Source: Ambulatory Visit | Attending: Gastroenterology | Admitting: Gastroenterology

## 2018-07-26 ENCOUNTER — Other Ambulatory Visit: Payer: Self-pay | Admitting: Gastroenterology

## 2018-07-26 DIAGNOSIS — R160 Hepatomegaly, not elsewhere classified: Secondary | ICD-10-CM | POA: Insufficient documentation

## 2018-07-26 DIAGNOSIS — K8020 Calculus of gallbladder without cholecystitis without obstruction: Secondary | ICD-10-CM | POA: Diagnosis not present

## 2018-07-26 LAB — POCT I-STAT CREATININE: Creatinine, Ser: 0.9 mg/dL (ref 0.44–1.00)

## 2018-07-26 MED ORDER — GADOBUTROL 1 MMOL/ML IV SOLN
9.0000 mL | Freq: Once | INTRAVENOUS | Status: AC | PRN
  Administered 2018-07-26: 9 mL via INTRAVENOUS

## 2018-08-11 ENCOUNTER — Other Ambulatory Visit: Payer: Self-pay

## 2018-08-11 ENCOUNTER — Encounter: Payer: Self-pay | Admitting: Emergency Medicine

## 2018-08-11 ENCOUNTER — Ambulatory Visit
Admission: EM | Admit: 2018-08-11 | Discharge: 2018-08-11 | Disposition: A | Discharge status: Home / Self Care | Payer: Medicare Other | Attending: Family Medicine | Admitting: Family Medicine

## 2018-08-11 DIAGNOSIS — J45901 Unspecified asthma with (acute) exacerbation: Secondary | ICD-10-CM | POA: Insufficient documentation

## 2018-08-11 MED ORDER — PREDNISONE 50 MG PO TABS: ORAL_TABLET | ORAL | 0 refills | Status: DC

## 2018-08-11 NOTE — Discharge Instructions (Signed)
Continue to use your inhalers.  Prednisone as prescribed.  Take care  Dr. Lacinda Axon

## 2018-08-11 NOTE — ED Triage Notes (Signed)
Patient c/o chest congestion and cough that started 2 days ago. Patient denies fever. Patient has taken OTC Coricidin for her symptoms.

## 2018-08-11 NOTE — ED Provider Notes (Signed)
[facility]    CSN: [id number] Arrival date & time: 08/11/18  0847  History   Chief Complaint Chief Complaint  Patient presents with  . Cough    HPI  77 year old female with asthma/COPD (per pulmonology) presents with cough and congestion.  Patient reports that it started on December 24.  She reports cough and associated congestion.  She states that cough is not particular productive.  She reports mild shortness of breath.  Per pulmonology and cardiology, she is short of breath at baseline.  No fever.  No chills.  She has taken Coricidin for her cough without resolution.  She endorses compliance with her albuterol as well as her Symbicort.  No known exacerbating factors.  No other associated symptoms.  No other complaints.  Past Medical History:  Diagnosis Date  . Asthma   . Diabetes mellitus without complication ([place])   . Diverticulosis   . Hypercholesterolemia   . Hypertension   . Irregular heart rhythm    evaluated by Cardiologist at [place], [place], with stress test and cardiac cath, awaiting records  . Obese   . Spinal stenosis    neck and back  . 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 ABDOMINAL HYSTERECTOMY    . APPENDECTOMY    . BACK SURGERY    . BREAST BIOPSY Right    negative 1970's  . CARDIAC CATHETERIZATION    . COLONOSCOPY    . COLONOSCOPY WITH PROPOFOL N/A 02/20/2018   Procedure: COLONOSCOPY WITH PROPOFOL;  Surgeon: Lollie Sails, MD;  Location: [facility] ENDOSCOPY;  Service: Endoscopy;  Laterality: N/A;  . ESOPHAGOGASTRODUODENOSCOPY (EGD) WITH PROPOFOL N/A 11/25/2015   Procedure: ESOPHAGOGASTRODUODENOSCOPY (EGD) WITH PROPOFOL;  Surgeon: Lollie Sails, MD;  Location: [facility] ENDOSCOPY;  Service: Endoscopy;  Laterality: N/A;  . EXPLORATION OF SPINAL FUSION    . NECK SURGERY    . TUBAL LIGATION     OB History   No obstetric history on file.    Home  Medications    Prior to Admission medications   Medication Sig Start Date End Date Taking? Authorizing Provider  albuterol (PROVENTIL HFA;VENTOLIN HFA) 108 (90 BASE) MCG/ACT inhaler Inhale 1 puff into the lungs every 6 (six) hours as needed for wheezing or shortness of breath.    Yes [provider]  budesonide-formoterol (SYMBICORT) 160-4.5 MCG/ACT inhaler Inhale 2 puffs into the lungs 2 (two) times daily.   Yes [provider]  Esomeprazole Magnesium (NEXIUM 24HR PO) Take 22.3 mg by mouth daily. OTC Nexium   Yes [provider]  fexofenadine (ALLEGRA) 30 MG tablet Take 30 mg by mouth daily as needed (for allergies).    Yes [provider]  fluticasone (FLONASE) 50 MCG/ACT nasal spray Place 1 spray into both nostrils daily.   Yes [provider]  furosemide (LASIX) 20 MG tablet Take 20 mg by mouth.   Yes [provider]  furosemide (LASIX) 40 MG tablet Take 1 tablet by mouth daily. 05/16/15  Yes [provider]  hydrALAZINE (APRESOLINE) 10 MG tablet Take 1 tablet (10 mg total) by mouth 2 (two) times daily as needed (elevated blood pressure). 10/17/16  Yes Harrie Foreman, MD  LIVALO 2 MG TABS Take 2 mg by mouth daily. 08/13/15  Yes [provider]  Loperamide HCl (IMODIUM PO) Take 1 tablet by mouth daily as needed (for diarrhea).   Yes [provider]  metoprolol (LOPRESSOR) 100 MG  tablet Take 1 tablet by mouth 2 (two) times daily. 07/18/15  Yes [provider]  montelukast (SINGULAIR) 10 MG tablet Take 10 mg by mouth at bedtime as needed (for allergies).    Yes [provider]  rosuvastatin (CRESTOR) 20 MG tablet Take 20 mg by mouth daily.   Yes [provider]  valsartan-hydrochlorothiazide (DIOVAN-HCT) 320-25 MG tablet Take 1 tablet by mouth daily. 05/16/15  Yes [provider]  esomeprazole (NEXIUM) 40 MG capsule Take 1 capsule (40 mg total) by mouth daily. 08/21/15 08/20/16   Earleen Newport, MD  hydrALAZINE (APRESOLINE) 25 MG tablet Take 25 mg by mouth 3 (three) times daily.    [provider]  methylcellulose oral powder Take 1 packet by mouth daily. 19gm powder mixed with 267mL fluid    [provider]  potassium chloride (K-DUR,KLOR-CON) 10 MEQ tablet Take 10 mEq by mouth 2 (two) times daily.    [provider]  predniSONE (DELTASONE) 50 MG tablet 1 tablet daily x 5 days 08/11/18   Coral Spikes, DO   Family History Family History  Problem Relation Age of Onset  . Colon cancer Mother   . Emphysema Father   . Breast cancer Neg Hx    Social History Social History   Tobacco Use  . Smoking status: Never Smoker  . Smokeless tobacco: Never Used  Substance Use Topics  . Alcohol use: Yes    Comment: occasional  . Drug use: No     Allergies   Ace inhibitors; Iodine; Latex; Lotrel [amlodipine besy-benazepril hcl]; and Adhesive [tape]   Review of Systems Review of Systems  Constitutional: Negative for fever.  Respiratory: Positive for cough and shortness of breath.    Physical Exam Triage Vital Signs ED Triage Vitals  Enc Vitals Group     BP 08/11/18 0857 93/66     Pulse Rate 08/11/18 0857 75     Resp 08/11/18 0857 18     Temp 08/11/18 0857 98.6 F (37 C)     Temp Source 08/11/18 0857 Oral     SpO2 08/11/18 0857 96 %     Weight 08/11/18 0859 205 lb (93 kg)     Height 08/11/18 0859 5\' 2"  (1.575 m)     Head Circumference --      Peak Flow --      Pain Score 08/11/18 0859 0     Pain Loc --      Pain Edu? --      Excl. in [place]? --    Updated Vital Signs BP 93/66 (BP Location: Left Arm)   Pulse 75   Temp 98.6 F (37 C) (Oral)   Resp 18   Ht 5\' 2"  (1.575 m)   Wt 93 kg   SpO2 96%   BMI 37.49 kg/m   Visual Acuity Right Eye Distance:   Left Eye Distance:   Bilateral Distance:    Right Eye Near:   Left Eye Near:    Bilateral Near:     Physical Exam Constitutional:      General: She is not in acute  distress.    Appearance: Normal appearance.  HENT:     Head: Normocephalic and atraumatic.     Mouth/Throat:     Pharynx: Oropharynx is clear.     Comments: Oropharynx with mild erythema. Eyes:     General: No scleral icterus.    Conjunctiva/sclera: Conjunctivae normal.  Cardiovascular:     Rate and Rhythm: Normal rate and  regular rhythm.  Pulmonary:     Effort: Pulmonary effort is normal. No respiratory distress.     Comments: Diffuse expiratory wheezing. Neurological:     Mental Status: She is alert.  Psychiatric:        Mood and Affect: Mood normal.        Behavior: Behavior normal.    UC Treatments / Results  Labs (all labs ordered are listed, but only abnormal results are displayed) Labs Reviewed - No data to display  EKG None  Radiology No results found.  Procedures Procedures (including critical care time)  Medications Ordered in UC Medications - No data to display  Initial Impression / Assessment and Plan / UC Course  I have reviewed the triage vital signs and the nursing notes.  Pertinent labs & imaging results that were available during my care of the patient were reviewed by me and considered in my medical decision making (see chart for details).    77 year old female presents with asthma/COPD exacerbation.  No indication for antibiotics at this time.  Treating with prednisone.  Final Clinical Impressions(s) / UC Diagnoses   Final diagnoses:  Asthma with acute exacerbation, unspecified asthma severity, unspecified whether persistent     Discharge Instructions     Continue to use your inhalers.  Prednisone as prescribed.  Take care  Dr. Lacinda Axon    ED Prescriptions    Medication Sig Dispense Auth. Provider   predniSONE (DELTASONE) 50 MG tablet 1 tablet daily x 5 days 5 tablet Coral Spikes, DO     Controlled Substance Prescriptions [place] Controlled Substance Registry consulted? Not Applicable   Coral Spikes, [place] 08/11/18 [id number]

## 2018-11-14 ENCOUNTER — Other Ambulatory Visit: Payer: Self-pay | Admitting: Gastroenterology

## 2018-11-14 DIAGNOSIS — R160 Hepatomegaly, not elsewhere classified: Secondary | ICD-10-CM

## 2018-11-20 ENCOUNTER — Ambulatory Visit
Admission: RE | Admit: 2018-11-20 | Discharge: 2018-11-20 | Disposition: A | Discharge status: Home / Self Care | Payer: Medicare Other | Source: Ambulatory Visit | Attending: Gastroenterology | Admitting: Gastroenterology

## 2018-11-20 ENCOUNTER — Other Ambulatory Visit: Payer: Self-pay

## 2018-11-20 DIAGNOSIS — R160 Hepatomegaly, not elsewhere classified: Secondary | ICD-10-CM

## 2019-01-30 ENCOUNTER — Other Ambulatory Visit: Payer: Self-pay | Admitting: Gastroenterology

## 2019-01-30 DIAGNOSIS — R160 Hepatomegaly, not elsewhere classified: Secondary | ICD-10-CM

## 2019-02-27 ENCOUNTER — Other Ambulatory Visit: Payer: Self-pay

## 2019-02-27 ENCOUNTER — Other Ambulatory Visit: Payer: Self-pay | Admitting: Gastroenterology

## 2019-02-27 ENCOUNTER — Ambulatory Visit
Admission: RE | Admit: 2019-02-27 | Discharge: 2019-02-27 | Disposition: A | Discharge status: Home / Self Care | Payer: Medicare Other | Source: Ambulatory Visit | Attending: Gastroenterology | Admitting: Gastroenterology

## 2019-02-27 ENCOUNTER — Ambulatory Visit: Payer: Medicare Other

## 2019-02-27 DIAGNOSIS — R160 Hepatomegaly, not elsewhere classified: Secondary | ICD-10-CM | POA: Diagnosis not present

## 2019-02-27 MED ORDER — GADOBUTROL 1 MMOL/ML IV SOLN: 9.0000 mL | Freq: Once | INTRAVENOUS | Status: DC | PRN

## 2019-03-08 ENCOUNTER — Other Ambulatory Visit: Payer: Self-pay

## 2019-03-08 ENCOUNTER — Encounter: Payer: Self-pay | Admitting: Emergency Medicine

## 2019-03-08 ENCOUNTER — Ambulatory Visit
Admission: EM | Admit: 2019-03-08 | Discharge: 2019-03-08 | Disposition: A | Discharge status: Home / Self Care | Payer: Medicare Other | Attending: Family Medicine | Admitting: Family Medicine

## 2019-03-08 DIAGNOSIS — K037 Posteruptive color changes of dental hard tissues: Secondary | ICD-10-CM | POA: Diagnosis not present

## 2019-03-08 DIAGNOSIS — R202 Paresthesia of skin: Secondary | ICD-10-CM

## 2019-03-08 NOTE — ED Triage Notes (Signed)
Pt here for some brown discoloration to upper gums. Pt also reports left side of face feels numb/tingling x1 week.

## 2019-03-08 NOTE — Discharge Instructions (Signed)
Aspirin 81 mg daily.  See your dentist.  Also see your PCP.  Take care  Dr. Lacinda Axon

## 2019-03-08 NOTE — ED Provider Notes (Signed)
[facility]    CSN: [id number] Arrival date & time: 03/08/19  1151   History   Chief Complaint Chief Complaint  Patient presents with  . Gum discoloration   HPI  78 year old female presents the above complaint.  Patient states that over the past 2 to 3 weeks she has noted left-sided facial tingling.  Patient states that approximate 1 week ago she noticed some discoloration of her upper growths on that side.  Denies dental pain.  Denies weakness.  She has not seen her dentist or primary care physician.  No medications or interventions tried.  No other associated symptoms.  No other complaints.   PMH, Surgical Hx, Family Hx, Social History reviewed and updated as below.  Past Medical History:  Diagnosis Date  . Asthma   . Diabetes mellitus without complication ([place])   . Diverticulosis   . Hypercholesterolemia   . Hypertension   . Irregular heart rhythm    evaluated by Cardiologist at [place], [place], with stress test and cardiac cath, awaiting records  . Obese   . Spinal stenosis    neck and back  . 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 ABDOMINAL HYSTERECTOMY    . APPENDECTOMY    . BACK SURGERY    . BREAST BIOPSY Right    negative 1970's  . CARDIAC CATHETERIZATION    . COLONOSCOPY    . COLONOSCOPY WITH PROPOFOL N/A 02/20/2018   Procedure: COLONOSCOPY WITH PROPOFOL;  Surgeon: Lollie Sails, MD;  Location: [facility] ENDOSCOPY;  Service: Endoscopy;  Laterality: N/A;  . ESOPHAGOGASTRODUODENOSCOPY (EGD) WITH PROPOFOL N/A 11/25/2015   Procedure: ESOPHAGOGASTRODUODENOSCOPY (EGD) WITH PROPOFOL;  Surgeon: Lollie Sails, MD;  Location: [facility] ENDOSCOPY;  Service: Endoscopy;  Laterality: N/A;  . EXPLORATION OF SPINAL FUSION    . NECK SURGERY    . TUBAL LIGATION     OB History   No obstetric history on file.    Home Medications    Prior to Admission medications   Medication  Sig Start Date End Date Taking? Authorizing Provider  albuterol (PROVENTIL HFA;VENTOLIN HFA) 108 (90 BASE) MCG/ACT inhaler Inhale 1 puff into the lungs every 6 (six) hours as needed for wheezing or shortness of breath.     [provider]  budesonide-formoterol (SYMBICORT) 160-4.5 MCG/ACT inhaler Inhale 2 puffs into the lungs 2 (two) times daily.    [provider]  esomeprazole (NEXIUM) 40 MG capsule Take 1 capsule (40 mg total) by mouth daily. 08/21/15 08/20/16  Earleen Newport, MD  Esomeprazole Magnesium (NEXIUM 24HR PO) Take 22.3 mg by mouth daily. OTC Nexium    [provider]  fexofenadine (ALLEGRA) 30 MG tablet Take 30 mg by mouth daily as needed (for allergies).     [provider]  fluticasone (FLONASE) 50 MCG/ACT nasal spray Place 1 spray into both nostrils daily.    [provider]  furosemide (LASIX) 20 MG tablet Take 20 mg by mouth.    [provider]  furosemide (LASIX) 40 MG tablet Take 1 tablet by mouth daily. 05/16/15   [provider]  hydrALAZINE (APRESOLINE) 10 MG tablet Take 1 tablet (10 mg total) by mouth 2 (two) times daily as needed (elevated blood pressure). 10/17/16   Harrie Foreman, MD  hydrALAZINE (APRESOLINE) 25 MG tablet Take 25 mg by mouth 3 (three) times daily.    [provider]  LIVALO 2 MG TABS Take 2  mg by mouth daily. 08/13/15   [provider]  Loperamide HCl (IMODIUM PO) Take 1 tablet by mouth daily as needed (for diarrhea).    [provider]  methylcellulose oral powder Take 1 packet by mouth daily. 19gm powder mixed with 240mL fluid    [provider]  metoprolol (LOPRESSOR) 100 MG tablet Take 1 tablet by mouth 2 (two) times daily. 07/18/15   [provider]  montelukast (SINGULAIR) 10 MG tablet Take 10 mg by mouth at bedtime as needed (for allergies).     [provider]  potassium chloride (K-DUR,KLOR-CON) 10 MEQ tablet Take 10 mEq by mouth  2 (two) times daily.    [provider]  predniSONE (DELTASONE) 50 MG tablet 1 tablet daily x 5 days 08/11/18   Coral Spikes, DO  rosuvastatin (CRESTOR) 20 MG tablet Take 20 mg by mouth daily.    [provider]  valsartan-hydrochlorothiazide (DIOVAN-HCT) 320-25 MG tablet Take 1 tablet by mouth daily. 05/16/15   [provider]    Family History Family History  Problem Relation Age of Onset  . Colon cancer Mother   . Emphysema Father   . Breast cancer Neg Hx     Social History Social History   Tobacco Use  . Smoking status: Never Smoker  . Smokeless tobacco: Never Used  Substance Use Topics  . Alcohol use: Yes    Comment: occasional  . Drug use: No     Allergies   Ace inhibitors, Iodine, Latex, Lotrel [amlodipine besy-benazepril hcl], and Adhesive [tape]   Review of Systems Review of Systems  HENT:       Gum discoloration. Facial tingling.  Neurological: Negative for weakness.   Physical Exam Triage Vital Signs ED Triage Vitals  Enc Vitals Group     BP 03/08/19 1207 (!) 132/92     Pulse Rate 03/08/19 1207 (!) 52     Resp 03/08/19 1207 16     Temp 03/08/19 1207 98 F (36.7 C)     Temp Source 03/08/19 1207 Oral     SpO2 03/08/19 1207 98 %     Weight 03/08/19 1208 212 lb (96.2 kg)     Height 03/08/19 1208 5\' 2"  (1.575 m)     Head Circumference --      Peak Flow --      Pain Score 03/08/19 1208 0     Pain Loc --      Pain Edu? --      Excl. in [place]? --    Updated Vital Signs BP (!) 132/92   Pulse (!) 52   Temp 98 F (36.7 C) (Oral)   Resp 16   Ht 5\' 2"  (1.575 m)   Wt 96.2 kg   SpO2 98%   BMI 38.78 kg/m   Visual Acuity Right Eye Distance:   Left Eye Distance:   Bilateral Distance:    Right Eye Near:   Left Eye Near:    Bilateral Near:     Physical Exam Vitals signs and nursing note reviewed.  Constitutional:      General: She is not in acute distress.    Appearance: Normal appearance. She is obese.  HENT:      Head: Normocephalic and atraumatic.     Mouth/Throat:     Pharynx: Oropharynx is clear.     Comments: Several areas where the gums appear darker. Eyes:     General:        Right eye: No discharge.  Left eye: No discharge.     Conjunctiva/sclera: Conjunctivae normal.  Cardiovascular:     Rate and Rhythm: Regular rhythm. Bradycardia present.  Pulmonary:     Effort: Pulmonary effort is normal.     Breath sounds: Normal breath sounds.  Neurological:     General: No focal deficit present.     Mental Status: She is alert and oriented to person, place, and time.     Cranial Nerves: No cranial nerve deficit.     Motor: No weakness.  Psychiatric:        Mood and Affect: Mood normal.        Behavior: Behavior normal.    UC Treatments / Results  Labs (all labs ordered are listed, but only abnormal results are displayed) Labs Reviewed - No data to display  EKG   Radiology No results found.  Procedures Procedures (including critical care time)  Medications Ordered in UC Medications - No data to display  Initial Impression / Assessment and Plan / UC Course  I have reviewed the triage vital signs and the nursing notes.  Pertinent labs & imaging results that were available during my care of the patient were reviewed by me and considered in my medical decision making (see chart for details).    78 year old female presents with gum discoloration and tingling of the left side of her face.  No evidence of CVA.  Advised to take aspirin 81 mg daily.  Advised to see dentist as well as primary care physician.  Final Clinical Impressions(s) / UC Diagnoses   Final diagnoses:  Facial tingling sensation     Discharge Instructions     Aspirin 81 mg daily.  See your dentist.  Also see your PCP.  Take care  Dr. Lacinda Axon   ED Prescriptions    None     Controlled Substance Prescriptions [place] Controlled Substance Registry consulted? Not Applicable   Coral Spikes, DO  03/08/19 1334

## 2019-03-26 ENCOUNTER — Other Ambulatory Visit: Payer: Self-pay | Admitting: Internal Medicine

## 2019-03-26 DIAGNOSIS — Z1231 Encounter for screening mammogram for malignant neoplasm of breast: Secondary | ICD-10-CM

## 2019-05-02 ENCOUNTER — Ambulatory Visit
Admission: RE | Admit: 2019-05-02 | Discharge: 2019-05-02 | Disposition: A | Discharge status: Home / Self Care | Payer: Medicare Other | Source: Ambulatory Visit | Attending: Internal Medicine | Admitting: Internal Medicine

## 2019-05-02 DIAGNOSIS — Z1231 Encounter for screening mammogram for malignant neoplasm of breast: Secondary | ICD-10-CM | POA: Insufficient documentation

## 2019-12-25 IMAGING — MG MM DIGITAL SCREENING BILAT W/ TOMO W/ CAD
6 of 10 series · 6 of 30 positions shown · non-contrast
Comparison: Previous exam(s).

CLINICAL DATA: Screening.

EXAM:
DIGITAL SCREENING BILATERAL MAMMOGRAM WITH TOMO AND CAD

[R XCCL synth-2D]
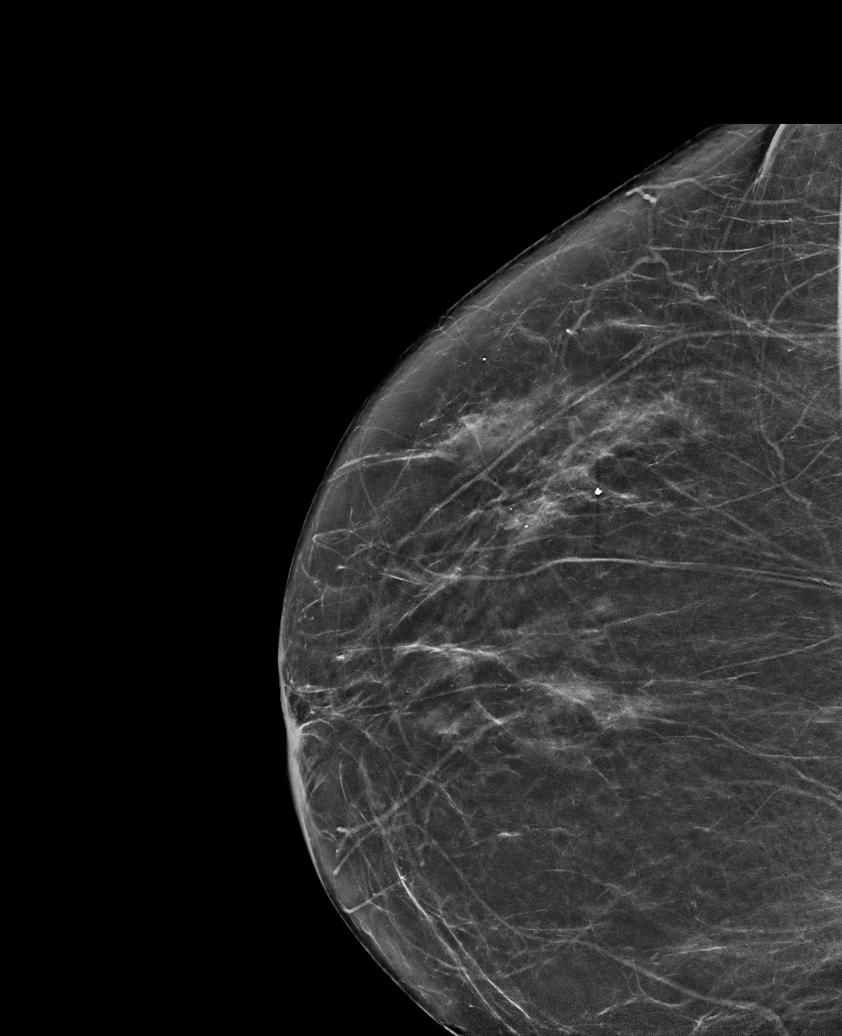

[L CC synth-2D]
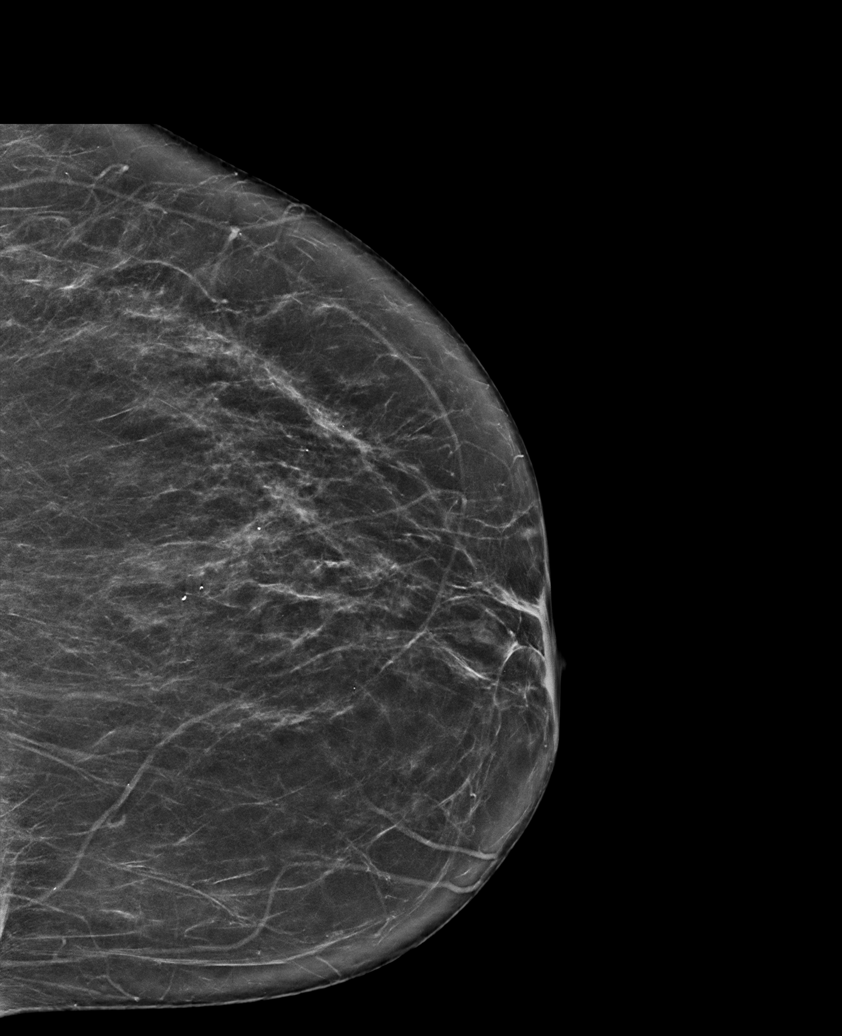

[L MLO synth-2D]
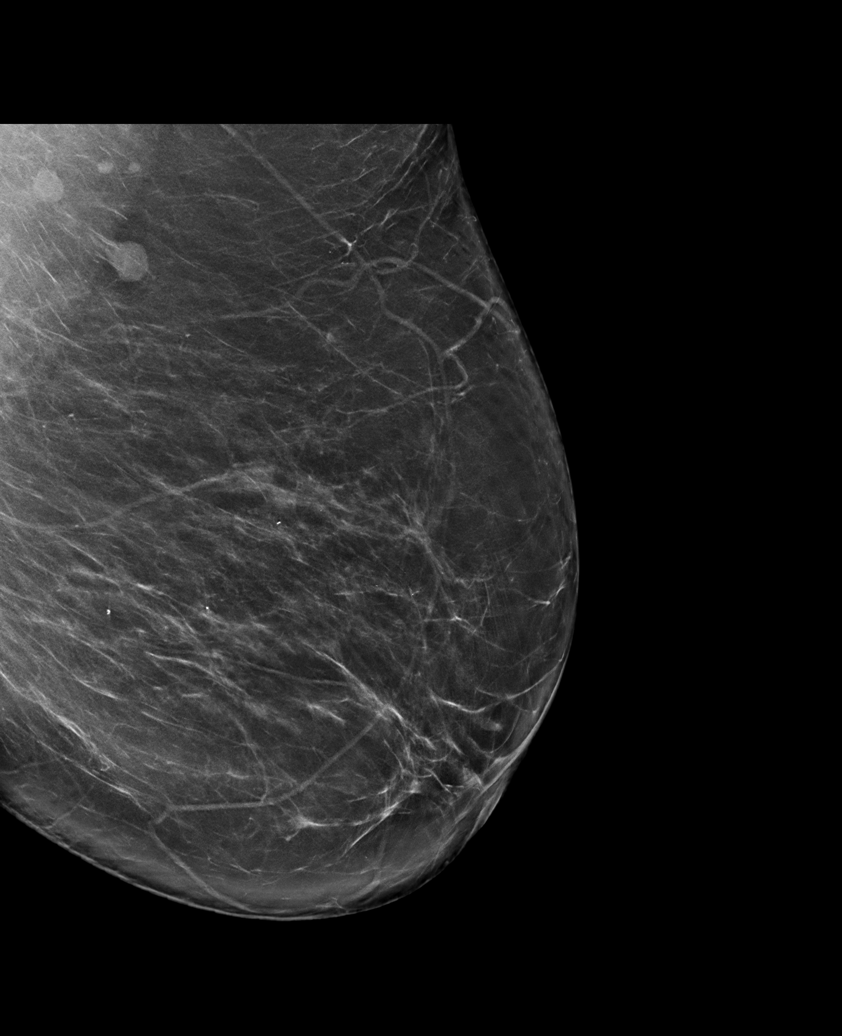

[R CC synth-2D]
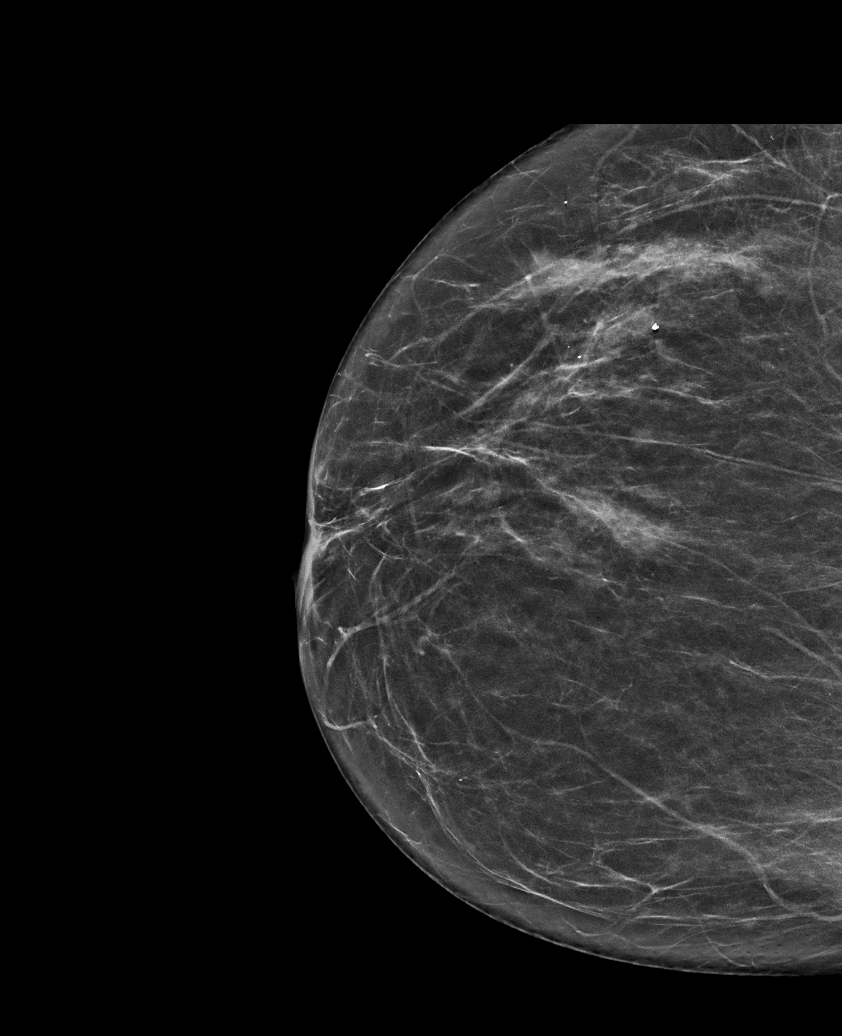

[R MLO synth-2D]
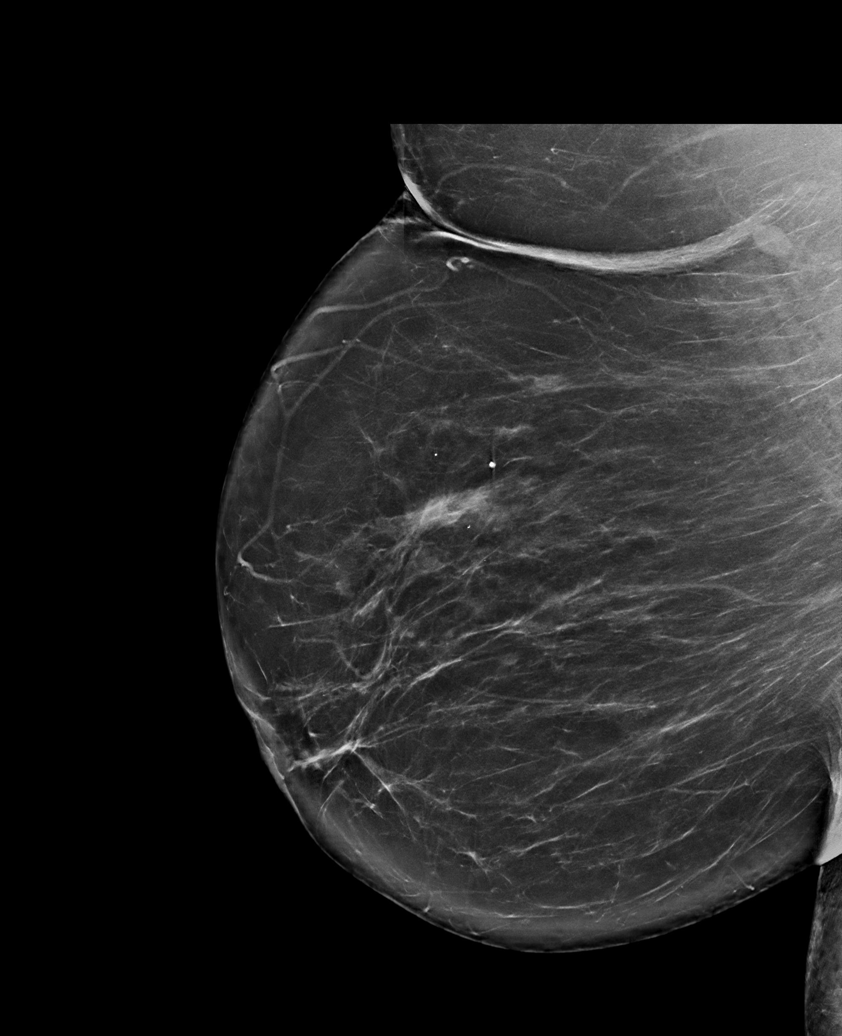

[L CC tomo · tomo slice 39/76.0]
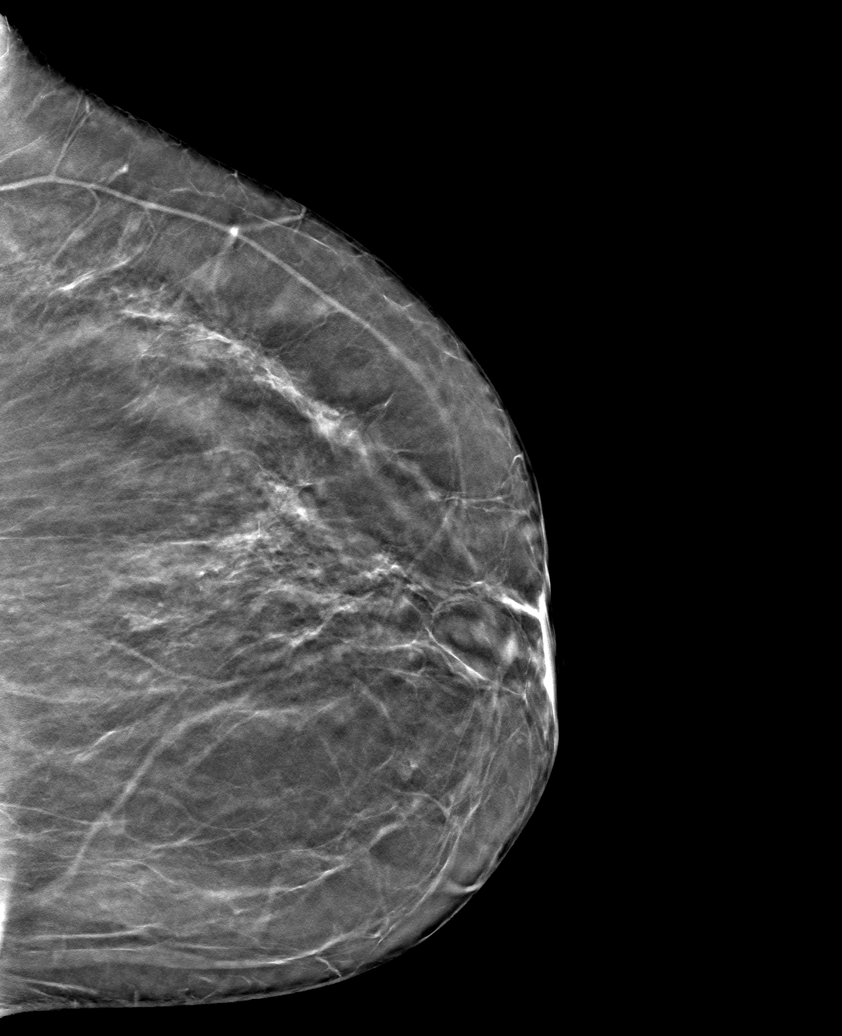

[6 of 30 positions shown; findings below may reference images not displayed]

ACR Breast Density Category c: The breast tissue is heterogeneously
dense, which may obscure small masses.
FINDINGS: There are no findings suspicious for malignancy. Images were
processed with CAD.
IMPRESSION: No mammographic evidence of malignancy. A result letter of this
screening mammogram will be mailed directly to the patient.

RECOMMENDATION:
Screening mammogram in one year. (Code:[id number]-[id number])

BI-RADS CATEGORY  1: Negative.

## 2020-02-22 ENCOUNTER — Other Ambulatory Visit: Payer: Self-pay | Admitting: Student

## 2020-02-22 DIAGNOSIS — R0609 Other forms of dyspnea: Secondary | ICD-10-CM

## 2020-02-22 DIAGNOSIS — R600 Localized edema: Secondary | ICD-10-CM

## 2020-03-03 ENCOUNTER — Ambulatory Visit
Admission: RE | Admit: 2020-03-03 | Discharge: 2020-03-03 | Disposition: A | Discharge status: Home / Self Care | Payer: Medicare Other | Source: Ambulatory Visit | Attending: Student | Admitting: Student

## 2020-03-03 ENCOUNTER — Other Ambulatory Visit: Payer: Self-pay

## 2020-03-03 DIAGNOSIS — R0600 Dyspnea, unspecified: Secondary | ICD-10-CM | POA: Diagnosis present

## 2020-03-03 DIAGNOSIS — R600 Localized edema: Secondary | ICD-10-CM | POA: Diagnosis not present

## 2020-03-03 DIAGNOSIS — R0609 Other forms of dyspnea: Secondary | ICD-10-CM

## 2020-06-05 ENCOUNTER — Other Ambulatory Visit: Payer: Self-pay | Admitting: Infectious Diseases

## 2020-06-05 DIAGNOSIS — Z1231 Encounter for screening mammogram for malignant neoplasm of breast: Secondary | ICD-10-CM

## 2020-06-09 ENCOUNTER — Ambulatory Visit
Admission: RE | Admit: 2020-06-09 | Discharge: 2020-06-09 | Disposition: A | Discharge status: Home / Self Care | Payer: Medicare Other | Source: Ambulatory Visit | Attending: Infectious Diseases | Admitting: Infectious Diseases

## 2020-06-09 ENCOUNTER — Other Ambulatory Visit: Payer: Self-pay

## 2020-06-09 DIAGNOSIS — Z1231 Encounter for screening mammogram for malignant neoplasm of breast: Secondary | ICD-10-CM | POA: Insufficient documentation

## 2020-12-04 ENCOUNTER — Other Ambulatory Visit: Payer: Self-pay | Admitting: Family Medicine

## 2020-12-04 ENCOUNTER — Inpatient Hospital Stay
Admission: EM | Admit: 2020-12-04 | Discharge: 2020-12-07 | DRG: 392 | Disposition: A | Discharge status: Home / Self Care | Payer: Medicare Other | Attending: Internal Medicine | Admitting: Internal Medicine

## 2020-12-04 ENCOUNTER — Ambulatory Visit
Admission: RE | Admit: 2020-12-04 | Discharge: 2020-12-04 | Disposition: A | Discharge status: Home / Self Care | Payer: Medicare Other | Source: Ambulatory Visit | Attending: Family Medicine | Admitting: Family Medicine

## 2020-12-04 ENCOUNTER — Other Ambulatory Visit: Payer: Self-pay

## 2020-12-04 DIAGNOSIS — Z6834 Body mass index (BMI) 34.0-34.9, adult: Secondary | ICD-10-CM

## 2020-12-04 DIAGNOSIS — R112 Nausea with vomiting, unspecified: Secondary | ICD-10-CM | POA: Insufficient documentation

## 2020-12-04 DIAGNOSIS — K5720 Diverticulitis of large intestine with perforation and abscess without bleeding: Secondary | ICD-10-CM | POA: Diagnosis not present

## 2020-12-04 DIAGNOSIS — K56609 Unspecified intestinal obstruction, unspecified as to partial versus complete obstruction: Secondary | ICD-10-CM

## 2020-12-04 DIAGNOSIS — Z800 Family history of malignant neoplasm of digestive organs: Secondary | ICD-10-CM

## 2020-12-04 DIAGNOSIS — Z7952 Long term (current) use of systemic steroids: Secondary | ICD-10-CM

## 2020-12-04 DIAGNOSIS — D72829 Elevated white blood cell count, unspecified: Secondary | ICD-10-CM | POA: Diagnosis present

## 2020-12-04 DIAGNOSIS — E876 Hypokalemia: Secondary | ICD-10-CM | POA: Diagnosis present

## 2020-12-04 DIAGNOSIS — Z7951 Long term (current) use of inhaled steroids: Secondary | ICD-10-CM

## 2020-12-04 DIAGNOSIS — J439 Emphysema, unspecified: Secondary | ICD-10-CM | POA: Diagnosis present

## 2020-12-04 DIAGNOSIS — R5383 Other fatigue: Secondary | ICD-10-CM

## 2020-12-04 DIAGNOSIS — E669 Obesity, unspecified: Secondary | ICD-10-CM | POA: Diagnosis present

## 2020-12-04 DIAGNOSIS — N179 Acute kidney failure, unspecified: Secondary | ICD-10-CM | POA: Diagnosis present

## 2020-12-04 DIAGNOSIS — E871 Hypo-osmolality and hyponatremia: Secondary | ICD-10-CM | POA: Diagnosis present

## 2020-12-04 DIAGNOSIS — K769 Liver disease, unspecified: Secondary | ICD-10-CM | POA: Diagnosis present

## 2020-12-04 DIAGNOSIS — R634 Abnormal weight loss: Secondary | ICD-10-CM | POA: Diagnosis present

## 2020-12-04 DIAGNOSIS — E785 Hyperlipidemia, unspecified: Secondary | ICD-10-CM | POA: Diagnosis present

## 2020-12-04 DIAGNOSIS — Z7982 Long term (current) use of aspirin: Secondary | ICD-10-CM

## 2020-12-04 DIAGNOSIS — F0390 Unspecified dementia without behavioral disturbance: Secondary | ICD-10-CM | POA: Diagnosis present

## 2020-12-04 DIAGNOSIS — Z79899 Other long term (current) drug therapy: Secondary | ICD-10-CM

## 2020-12-04 DIAGNOSIS — Z20822 Contact with and (suspected) exposure to covid-19: Secondary | ICD-10-CM | POA: Diagnosis present

## 2020-12-04 DIAGNOSIS — K5792 Diverticulitis of intestine, part unspecified, without perforation or abscess without bleeding: Secondary | ICD-10-CM

## 2020-12-04 DIAGNOSIS — Z86010 Personal history of colonic polyps: Secondary | ICD-10-CM

## 2020-12-04 DIAGNOSIS — K5732 Diverticulitis of large intestine without perforation or abscess without bleeding: Secondary | ICD-10-CM | POA: Diagnosis present

## 2020-12-04 DIAGNOSIS — Z981 Arthrodesis status: Secondary | ICD-10-CM

## 2020-12-04 DIAGNOSIS — K567 Ileus, unspecified: Secondary | ICD-10-CM | POA: Diagnosis present

## 2020-12-04 DIAGNOSIS — K56600 Partial intestinal obstruction, unspecified as to cause: Secondary | ICD-10-CM | POA: Diagnosis present

## 2020-12-04 DIAGNOSIS — K8020 Calculus of gallbladder without cholecystitis without obstruction: Secondary | ICD-10-CM | POA: Diagnosis present

## 2020-12-04 DIAGNOSIS — Z825 Family history of asthma and other chronic lower respiratory diseases: Secondary | ICD-10-CM

## 2020-12-04 DIAGNOSIS — I10 Essential (primary) hypertension: Secondary | ICD-10-CM | POA: Diagnosis present

## 2020-12-04 DIAGNOSIS — E119 Type 2 diabetes mellitus without complications: Secondary | ICD-10-CM | POA: Diagnosis present

## 2020-12-04 LAB — RESP PANEL BY RT-PCR (FLU A&B, COVID) ARPGX2
Influenza A by PCR: NEGATIVE
Influenza B by PCR: NEGATIVE
SARS Coronavirus 2 by RT PCR: NEGATIVE

## 2020-12-04 LAB — CBC
HCT: 37.7 % (ref 36.0–46.0)
Hemoglobin: 12.5 g/dL (ref 12.0–15.0)
MCH: 29.5 pg (ref 26.0–34.0)
MCHC: 33.2 g/dL (ref 30.0–36.0)
MCV: 88.9 fL (ref 80.0–100.0)
Platelets: 256 10*3/uL (ref 150–400)
RBC: 4.24 MIL/uL (ref 3.87–5.11)
RDW: 13.3 % (ref 11.5–15.5)
WBC: 13.8 10*3/uL — ABNORMAL HIGH (ref 4.0–10.5)
nRBC: 0 % (ref 0.0–0.2)

## 2020-12-04 LAB — BASIC METABOLIC PANEL
Anion gap: 14 (ref 5–15)
BUN: 48 mg/dL — ABNORMAL HIGH (ref 8–23)
CO2: 26 mmol/L (ref 22–32)
Calcium: 10.1 mg/dL (ref 8.9–10.3)
Chloride: 90 mmol/L — ABNORMAL LOW (ref 98–111)
Creatinine, Ser: 2.77 mg/dL — ABNORMAL HIGH (ref 0.44–1.00)
GFR, Estimated: 17 mL/min — ABNORMAL LOW (ref >60)
Glucose, Bld: 147 mg/dL — ABNORMAL HIGH (ref 70–99)
Potassium: 3.1 mmol/L — ABNORMAL LOW (ref 3.5–5.1)
Sodium: 130 mmol/L — ABNORMAL LOW (ref 135–145)

## 2020-12-04 LAB — MAGNESIUM: Magnesium: 1.7 mg/dL (ref 1.7–2.4)

## 2020-12-04 MED ORDER — MOMETASONE FURO-FORMOTEROL FUM 200-5 MCG/ACT IN AERO
2.0000 | INHALATION_SPRAY | Freq: Two times a day (BID) | RESPIRATORY_TRACT | Status: DC
  Administered 2020-12-04 – 2020-12-07 (×6): 2 via RESPIRATORY_TRACT
  Filled 2020-12-04: qty 8.8

## 2020-12-04 MED ORDER — ROSUVASTATIN CALCIUM 20 MG PO TABS
20.0000 mg | ORAL_TABLET | Freq: Every day | ORAL | Status: DC
  Administered 2020-12-05 – 2020-12-07 (×3): 20 mg via ORAL
  Filled 2020-12-04: qty 2
  Filled 2020-12-04 (×2): qty 1
  Filled 2020-12-04 (×2): qty 2
  Filled 2020-12-04: qty 1

## 2020-12-04 MED ORDER — HYDRALAZINE HCL 50 MG PO TABS: 25.0000 mg | ORAL_TABLET | Freq: Three times a day (TID) | ORAL | Status: DC

## 2020-12-04 MED ORDER — ONDANSETRON HCL 4 MG/2ML IJ SOLN: 4.0000 mg | Freq: Four times a day (QID) | INTRAMUSCULAR | Status: DC | PRN

## 2020-12-04 MED ORDER — CIPROFLOXACIN IN D5W 400 MG/200ML IV SOLN
400.0000 mg | INTRAVENOUS | Status: DC
  Administered 2020-12-04: 400 mg via INTRAVENOUS
  Filled 2020-12-04: qty 200

## 2020-12-04 MED ORDER — ONDANSETRON HCL 4 MG PO TABS: 4.0000 mg | ORAL_TABLET | Freq: Four times a day (QID) | ORAL | Status: DC | PRN

## 2020-12-04 MED ORDER — CIPROFLOXACIN IN D5W 400 MG/200ML IV SOLN
400.0000 mg | Freq: Once | INTRAVENOUS | Status: DC
  Filled 2020-12-04: qty 200

## 2020-12-04 MED ORDER — ACETAMINOPHEN 650 MG RE SUPP: 650.0000 mg | Freq: Four times a day (QID) | RECTAL | Status: DC | PRN

## 2020-12-04 MED ORDER — HYDRALAZINE HCL 25 MG PO TABS: 25.0000 mg | ORAL_TABLET | Freq: Three times a day (TID) | ORAL | Status: DC | PRN

## 2020-12-04 MED ORDER — ACETAMINOPHEN 325 MG PO TABS: 650.0000 mg | ORAL_TABLET | Freq: Four times a day (QID) | ORAL | Status: DC | PRN

## 2020-12-04 MED ORDER — LACTATED RINGERS IV SOLN: INTRAVENOUS | Status: DC

## 2020-12-04 MED ORDER — FUROSEMIDE 40 MG PO TABS: 20.0000 mg | ORAL_TABLET | Freq: Every day | ORAL | Status: DC

## 2020-12-04 MED ORDER — LORATADINE 10 MG PO TABS
10.0000 mg | ORAL_TABLET | Freq: Every day | ORAL | Status: DC
  Administered 2020-12-05 – 2020-12-07 (×3): 10 mg via ORAL
  Filled 2020-12-04 (×3): qty 1

## 2020-12-04 MED ORDER — METOPROLOL TARTRATE 50 MG PO TABS: 100.0000 mg | ORAL_TABLET | Freq: Two times a day (BID) | ORAL | Status: DC

## 2020-12-04 MED ORDER — SODIUM CHLORIDE 0.9 % IV BOLUS
1000.0000 mL | Freq: Once | INTRAVENOUS | Status: AC
  Administered 2020-12-05: 1000 mL via INTRAVENOUS

## 2020-12-04 MED ORDER — POTASSIUM CITRATE-CITRIC ACID 1100-334 MG/5ML PO SOLN
40.0000 meq | Freq: Once | ORAL | Status: AC
  Administered 2020-12-04: 40 meq via ORAL
  Filled 2020-12-04 (×2): qty 20

## 2020-12-04 MED ORDER — MONTELUKAST SODIUM 10 MG PO TABS: 10.0000 mg | ORAL_TABLET | Freq: Every evening | ORAL | Status: DC | PRN

## 2020-12-04 MED ORDER — FLUTICASONE PROPIONATE 50 MCG/ACT NA SUSP
1.0000 | Freq: Every day | NASAL | Status: DC
  Administered 2020-12-05 – 2020-12-07 (×3): 1 via NASAL
  Filled 2020-12-04: qty 16

## 2020-12-04 MED ORDER — SODIUM CHLORIDE 0.9 % IV SOLN: Freq: Once | INTRAVENOUS | Status: DC

## 2020-12-04 MED ORDER — METRONIDAZOLE IN NACL 5-0.79 MG/ML-% IV SOLN
500.0000 mg | Freq: Once | INTRAVENOUS | Status: DC
  Filled 2020-12-04: qty 100

## 2020-12-04 MED ORDER — ALBUTEROL SULFATE HFA 108 (90 BASE) MCG/ACT IN AERS
1.0000 | INHALATION_SPRAY | Freq: Four times a day (QID) | RESPIRATORY_TRACT | Status: DC | PRN
  Filled 2020-12-04: qty 6.7

## 2020-12-04 MED ORDER — METRONIDAZOLE IN NACL 5-0.79 MG/ML-% IV SOLN
500.0000 mg | Freq: Three times a day (TID) | INTRAVENOUS | Status: DC
  Administered 2020-12-04 – 2020-12-05 (×2): 500 mg via INTRAVENOUS
  Filled 2020-12-04 (×3): qty 100

## 2020-12-04 NOTE — H&P (Signed)
History and Physical   Erin Mcintosh MEQ:[id number] DOB: [birth year] DOA: 12/04/2020  PCP: [facility]  Outpatient Specialists: Dr. Clayborn Bigness Patient coming from: home  I have personally briefly reviewed patient's old medical records in [place].  Chief Concern: Generalized malaise  HPI: Erin Mcintosh is a 80 y.o. female with medical history significant for hypertension, hyperlipidemia, obesity, history of bilateral lower extremity edema, pulmonary emphysema, presents to the emergency department for chief concerns of CT imaging of diverticulitis with perforation.  She reports that she has been having generalized abdominal discomfort and poor appetite for three weeks. She endorses unintentional weight lost of about 10 pounds in the last three weeks. She denies changes to diet, other members feeling the same.   She states she tries to eat, but she just doesn't feel hungry. She denies nausea, vomiting, chest pain, shortness of breath, focal abdominal pain,dysuria, hematuria, diarrhea, blood in her stool.  She denies melena stool.  Social history: lives with her spouse and son. She denies history of tobacco, etoh, recreational drugs  Vaccinations: vaccinated for covid 19 with Moderna  At bedside, Erin Mcintosh is AAO to self, age, current year, current president, current location hospital  ROS: Constitutional: no weight change, no fever ENT/Mouth: no sore throat, no rhinorrhea Eyes: no eye pain, no vision changes Cardiovascular: no chest pain, no dyspnea,  no edema, no palpitations Respiratory: no cough, no sputum, no wheezing Gastrointestinal: no nausea, no vomiting, no diarrhea, no constipation Genitourinary: no urinary incontinence, no dysuria, no hematuria Musculoskeletal: no arthralgias, no myalgias Skin: no skin lesions, no pruritus, Neuro: + weakness, no loss of consciousness, no syncope Psych: no anxiety, no depression, + decrease appetite Heme/Lymph:  no bruising, no bleeding  ED Course: Discussed with ED provider, patient requiring hospitalization due to acute sigmoid diverticulitis with perforation.  Vitals in the emergency department was remarkable for temperature of 98.3, respiration rate of 15, heart rate 70, blood pressure 118/56, SPO2 at 100% on room air.  Labs in the emergency department was remarkable for WBC of 13.8, hemoglobin 12.5, platelets 256, sodium level 130, potassium 3.1, chloride 90, bicarb 26, BUN 48, serum creatinine 2.77, nonfasting blood glucose 147.  Assessment/Plan  Principal Problem:   Perforation of sigmoid colon due to diverticulitis Active Problems:   Hypo-osmolar hyponatremia   Essential hypertension   Hyperlipidemia   AKI (acute kidney injury) (HCC)   Leukocytosis   Unintentional weight loss   Hypokalemia   # Mild proximal sigmoid diverticulitis with perforation - Status post metronidazole 500 mg IV once, ciprofloxacin 400 mg IV once per ED provider - We will continue these antibiotics - General surgery has been consulted and recommends IV antibiotic and bowel rest, general surgery was see the patient - N.p.o. - LR IVF - Admit to MedSurg, observation, with telemetry ordered for 24 hours  # AKI- suspect prerenal secondary to poor p.o. intake # Hypokalemia-possibly secondary to AKI in setting of Lasix use - Baseline serum creatinine is 0.9-1.1, GFR greater than 60 - Normal saline 1 L bolus - LR at 125 mL/h, 1 day ordered -replace with citric acid potassium 40 mEq once - Checking magnesium level - BMP in the a.m.  # Hepatic lesion- CT abdomen without contrast evidence of hepatic parenchyma showing several ill-defined faint low densities area - Radiologist recommends MRI with and without gadolinium to better evaluate for possible neoplasm - Patient has an acute kidney injury with GFR less than 30 therefore I have not ordered MRI  with and without gadolinium  # Hypertension- resumed home  hydralazine at 25 mg p.o. every 8 hours as needed for SBP greater than 160 - Metoprolol 100 mg twice daily resumed for 12/05/2020 - Home dose of furosemide 20 mg, olmesartan-hydrochlorothiazide 40-25 mg for not resumed due to AKI  # Hyperlipidemia-resumed rosuvastatin 20 mg p.o. daily  # Pulmonary emphysema- resumed home inhalers, including albuterol as needed for wheezing and shortness of breath, Flonase HJR, montelukast 10 mg p.o. daily as needed for allergies  # Unintentional weight loss- recommend outpatient work-up  Preadmission testing-COVID PCR, influenza a and B were both negative  Family history of colon cancer diagnosed at the age of 20  Chart reviewed.  04/18/2018: Hepatitis A antibody, hepatitis B core antibody, hepatitis B surface antigen, hepatitis C virus antibody were negative  04/18/2018: Hepatitis B surface antibody quant was low indicating not immune  DVT prophylaxis: TED hose Code Status: Full code Diet: N.p.o. except for sips with meds and ice chips Family Communication: Updated daughter at bedside Disposition Plan: Pending clinical course Consults called: General surgery Admission status: Observation, MedSurg, telemetry  Past Medical History:  Diagnosis Date  . Asthma   . Diabetes mellitus without complication ([place])   . Diverticulosis   . Hypercholesterolemia   . Hypertension   . Irregular heart rhythm    evaluated by Cardiologist at [place], [place], with stress test and cardiac cath, awaiting records  . Obese   . Spinal stenosis    neck and back  . 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 ABDOMINAL HYSTERECTOMY    . APPENDECTOMY    . BACK SURGERY    . BREAST BIOPSY Right    negative 1970's  . CARDIAC CATHETERIZATION    . COLONOSCOPY    . COLONOSCOPY WITH PROPOFOL N/A 02/20/2018   Procedure: COLONOSCOPY WITH PROPOFOL;  Surgeon: Lollie Sails, MD;  Location: [facility]  ENDOSCOPY;  Service: Endoscopy;  Laterality: N/A;  . ESOPHAGOGASTRODUODENOSCOPY (EGD) WITH PROPOFOL N/A 11/25/2015   Procedure: ESOPHAGOGASTRODUODENOSCOPY (EGD) WITH PROPOFOL;  Surgeon: Lollie Sails, MD;  Location: [facility] ENDOSCOPY;  Service: Endoscopy;  Laterality: N/A;  . EXPLORATION OF SPINAL FUSION    . NECK SURGERY    . TUBAL LIGATION     Social History:  reports that she has never smoked. She has never used smokeless tobacco. She reports current alcohol use. She reports that she does not use drugs.  Allergies  Allergen Reactions  . Ace Inhibitors Cough  . Iodine Other (See Comments)    Reactions: Eyes swell  . Latex   . Lotrel [Amlodipine Besy-Benazepril Hcl]   . Adhesive [Tape] Rash   Family History  Problem Relation Age of Onset  . Colon cancer Mother   . Emphysema Father   . Breast cancer Neg Hx    Family history: Family history reviewed and not pertinent  Prior to Admission medications   Medication Sig Start Date End Date Taking? Authorizing Provider  albuterol (PROVENTIL HFA;VENTOLIN HFA) 108 (90 BASE) MCG/ACT inhaler Inhale 1 puff into the lungs every 6 (six) hours as needed for wheezing or shortness of breath.     [provider]  budesonide-formoterol (SYMBICORT) 160-4.5 MCG/ACT inhaler Inhale 2 puffs into the lungs 2 (two) times daily.    [provider]  esomeprazole (NEXIUM) 40 MG capsule Take 1 capsule (40 mg total) by mouth daily. 08/21/15 08/20/16  Earleen Newport, MD  Esomeprazole Magnesium (  NEXIUM 24HR PO) Take 22.3 mg by mouth daily. OTC Nexium    [provider]  fexofenadine (ALLEGRA) 30 MG tablet Take 30 mg by mouth daily as needed (for allergies).     [provider]  fluticasone (FLONASE) 50 MCG/ACT nasal spray Place 1 spray into both nostrils daily.    [provider]  furosemide (LASIX) 20 MG tablet Take 20 mg by mouth.    [provider]  furosemide (LASIX) 40 MG tablet Take 1 tablet by mouth  daily. 05/16/15   [provider]  hydrALAZINE (APRESOLINE) 10 MG tablet Take 1 tablet (10 mg total) by mouth 2 (two) times daily as needed (elevated blood pressure). 10/17/16   Harrie Foreman, MD  hydrALAZINE (APRESOLINE) 25 MG tablet Take 25 mg by mouth 3 (three) times daily.    [provider]  LIVALO 2 MG TABS Take 2 mg by mouth daily. 08/13/15   [provider]  Loperamide HCl (IMODIUM PO) Take 1 tablet by mouth daily as needed (for diarrhea).    [provider]  methylcellulose oral powder Take 1 packet by mouth daily. 19gm powder mixed with 279mL fluid    [provider]  metoprolol (LOPRESSOR) 100 MG tablet Take 1 tablet by mouth 2 (two) times daily. 07/18/15   [provider]  montelukast (SINGULAIR) 10 MG tablet Take 10 mg by mouth at bedtime as needed (for allergies).     [provider]  potassium chloride (K-DUR,KLOR-CON) 10 MEQ tablet Take 10 mEq by mouth 2 (two) times daily.    [provider]  predniSONE (DELTASONE) 50 MG tablet 1 tablet daily x 5 days 08/11/18   Coral Spikes, DO  rosuvastatin (CRESTOR) 20 MG tablet Take 20 mg by mouth daily.    [provider]  valsartan-hydrochlorothiazide (DIOVAN-HCT) 320-25 MG tablet Take 1 tablet by mouth daily. 05/16/15   [provider]   Physical Exam: Vitals:   12/04/20 1847 12/04/20 2000 12/04/20 2030 12/04/20 2100  BP: (!) 90/54 110/65 (!) 118/56 114/84  Pulse: 87 86 73 72  Resp: 19 17 16 18   Temp: 98.3 F (36.8 C)     SpO2: 96% 98% 97% 99%  Weight:      Height:       Constitutional: appears age-appropriate, NAD, calm, comfortable Eyes: PERRL, lids and conjunctivae normal ENMT: Mucous membranes are moist. Posterior pharynx clear of any exudate or lesions. Age-appropriate dentition. Hearing appropriate Neck: normal, supple, no masses, no thyromegaly Respiratory: clear to auscultation bilaterally, no wheezing, no crackles. Normal respiratory  effort. No accessory muscle use.  Cardiovascular: Regular rate and rhythm, no murmurs / rubs / gallops. No extremity edema. 2+ pedal pulses. No carotid bruits.  Abdomen: no tenderness, no masses palpated, no hepatosplenomegaly. Bowel sounds positive.  Musculoskeletal: no clubbing / cyanosis. No joint deformity upper and lower extremities. Good ROM, no contractures, no atrophy. Normal muscle tone.  Skin: no rashes, lesions, ulcers. No induration Neurologic: Sensation intact. Strength 5/5 in all 4.  Psychiatric: Normal judgment and insight. Alert and oriented x 3. Normal mood.   EKG: Not indicated as patient is not having chest pain or shortness of breath, we will place potassium and recheck labs in the a.m.  CT abdomen and pelvis on Admission: I personally reviewed and I agree with radiologist reading as below.  CT ABDOMEN PELVIS WO CONTRAST  Result Date: 12/04/2020 CLINICAL DATA:  Fatigue, anorexia. EXAM: CT ABDOMEN AND PELVIS WITHOUT CONTRAST TECHNIQUE: Multidetector CT imaging  of the abdomen and pelvis was performed following the standard protocol without IV contrast. COMPARISON:  None. FINDINGS: Lower chest: No acute abnormality. Hepatobiliary: Cholelithiasis is noted. No biliary dilatation is noted. Several ill-defined faint low densities are noted in the liver. Pancreas: Unremarkable. No pancreatic ductal dilatation or surrounding inflammatory changes. Spleen: Normal in size without focal abnormality. Adrenals/Urinary Tract: Adrenal glands are unremarkable. Kidneys are normal, without renal calculi, focal lesion, or hydronephrosis. Bladder is unremarkable. Stomach/Bowel: The stomach appears normal. Status post appendectomy. There is no evidence of bowel obstruction. Mild inflammatory changes are seen involving the proximal sigmoid colon consistent with diverticulitis. There is noted free air in the surrounding peritoneal fat consistent with perforation. No fluid collection or abscess is noted.  Vascular/Lymphatic: No significant vascular findings are present. No enlarged abdominal or pelvic lymph nodes. Reproductive: Status post hysterectomy. No adnexal masses. Other: No abdominal wall hernia or abnormality. No abdominopelvic ascites. Musculoskeletal: No acute or significant osseous findings. IMPRESSION: Mild proximal sigmoid diverticulitis is noted with small amount of air seen in the surrounding peritoneal fat consistent with perforation. No definite abscess or fluid collection is noted. These results will be called to the ordering clinician or representative by the Radiologist Assistant, and communication documented in the PACS or zVision Dashboard. Several ill-defined faint low densities are noted in the hepatic parenchyma; further evaluation with MRI with and without gadolinium is recommended to evaluate for possible neoplasm. Cholelithiasis. Electronically Signed   By: Marijo Conception M.D.   On: 12/04/2020 16:32   Labs on Admission: I have personally reviewed following labs  CBC: Recent Labs  Lab 12/04/20 1846  WBC 13.8*  HGB 12.5  HCT 37.7  MCV 88.9  PLT 891   Basic Metabolic Panel: Recent Labs  Lab 12/04/20 1846  NA 130*  K 3.1*  CL 90*  CO2 26  GLUCOSE 147*  BUN 48*  CREATININE 2.77*  CALCIUM 10.1   GFR: Estimated Creatinine Clearance: 15.4 mL/min (A) (by C-G formula based on SCr of 2.77 mg/dL (H)).  Urine analysis:    Component Value Date/Time   COLORURINE YELLOW (A) 08/21/2015 0844   APPEARANCEUR HAZY (A) 08/21/2015 0844   LABSPEC 1.016 08/21/2015 0844   PHURINE 5.0 08/21/2015 [place] 08/21/2015 0844   HGBUR NEGATIVE 08/21/2015 0844   BILIRUBINUR NEGATIVE 08/21/2015 0844   KETONESUR NEGATIVE 08/21/2015 0844   PROTEINUR NEGATIVE 08/21/2015 0844   NITRITE NEGATIVE 08/21/2015 0844   LEUKOCYTESUR NEGATIVE 08/21/2015 0844   Saraiah Bhat N Isaiyah Feldhaus D.O. [facility]  If 7PM-7AM, please contact overnight-coverage provider If 7AM-7PM, please  contact day coverage provider [email or web address]  12/04/2020, 10:04 PM

## 2020-12-04 NOTE — ED Triage Notes (Addendum)
Pt comes with c/o abnormal labs. Daughter with pt at this time and states the pt had an CT scan completed today and was later advised to come to ED for evaluation.  Pt has hx of dementia pt denies any pain at this time. Daughter reports vomiting for over week.  CT results as follow: Mild proximal sigmoid diverticulitis is noted with small amount of air seen in the surrounding peritoneal fat consistent with perforation.   Several ill-defined faint low densities are noted in the hepatic parenchyma; further evaluation with MRI with and without gadolinium is recommended to evaluate for possible neoplasm.

## 2020-12-04 NOTE — ED Provider Notes (Signed)
[facility] Emergency Department Provider Note    ____________________________________________   I have reviewed the triage vital signs and the nursing notes.   HISTORY  Chief Complaint Abdominal pain  History limited by: Not Limited   HPI Erin Mcintosh is a 80 y.o. female who presents to the emergency department today because of abnormal ct scan that was obtained earlier today. Patient went to physicians office earlier today because of concern for abdominal pain. She states that it has been going on for roughly 2 weeks. She cannot pinpoint where it hurts. This has been accompanied by decreased appetite. Daughter stated she felt like the patient has lost weight. No fevers. No bloody stool.   Records reviewed. Per medical record review patient has a history of DM, HLD.   Past Medical History:  Diagnosis Date  . Asthma   . Diabetes mellitus without complication ([place])   . Diverticulosis   . Hypercholesterolemia   . Hypertension   . Irregular heart rhythm    evaluated by Cardiologist at [place], [place], with stress test and cardiac cath, awaiting records  . Obese   . Spinal stenosis    neck and back  . 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   Past Surgical History:  Procedure Laterality Date  . ABDOMINAL HYSTERECTOMY    . APPENDECTOMY    . BACK SURGERY    . BREAST BIOPSY Right    negative 1970's  . CARDIAC CATHETERIZATION    . COLONOSCOPY    . COLONOSCOPY WITH PROPOFOL N/A 02/20/2018   Procedure: COLONOSCOPY WITH PROPOFOL;  Surgeon: Lollie Sails, MD;  Location: [facility] ENDOSCOPY;  Service: Endoscopy;  Laterality: N/A;  . ESOPHAGOGASTRODUODENOSCOPY (EGD) WITH PROPOFOL N/A 11/25/2015   Procedure: ESOPHAGOGASTRODUODENOSCOPY (EGD) WITH PROPOFOL;  Surgeon: Lollie Sails, MD;  Location: [facility] ENDOSCOPY;  Service: Endoscopy;  Laterality: N/A;  . EXPLORATION OF  SPINAL FUSION    . NECK SURGERY    . TUBAL LIGATION      Prior to Admission medications   Medication Sig Start Date End Date Taking? Authorizing Provider  albuterol (PROVENTIL HFA;VENTOLIN HFA) 108 (90 BASE) MCG/ACT inhaler Inhale 1 puff into the lungs every 6 (six) hours as needed for wheezing or shortness of breath.     [provider]  budesonide-formoterol (SYMBICORT) 160-4.5 MCG/ACT inhaler Inhale 2 puffs into the lungs 2 (two) times daily.    [provider]  esomeprazole (NEXIUM) 40 MG capsule Take 1 capsule (40 mg total) by mouth daily. 08/21/15 08/20/16  Earleen Newport, MD  Esomeprazole Magnesium (NEXIUM 24HR PO) Take 22.3 mg by mouth daily. OTC Nexium    [provider]  fexofenadine (ALLEGRA) 30 MG tablet Take 30 mg by mouth daily as needed (for allergies).     [provider]  fluticasone (FLONASE) 50 MCG/ACT nasal spray Place 1 spray into both nostrils daily.    [provider]  furosemide (LASIX) 20 MG tablet Take 20 mg by mouth.    [provider]  furosemide (LASIX) 40 MG tablet Take 1 tablet by mouth daily. 05/16/15   [provider]  hydrALAZINE (APRESOLINE) 10 MG tablet Take 1 tablet (10 mg total) by mouth 2 (two) times daily as needed (elevated blood pressure). 10/17/16   Harrie Foreman, MD  hydrALAZINE (APRESOLINE) 25 MG tablet Take 25 mg by mouth 3 (three) times daily.    [provider]  LIVALO 2 MG TABS Take 2 mg by mouth daily. 08/13/15   [provider]  Loperamide HCl (IMODIUM PO) Take 1 tablet by mouth daily as needed (for diarrhea).    [provider]  methylcellulose oral powder Take 1 packet by mouth daily. 19gm powder mixed with 245mL fluid    [provider]  metoprolol (LOPRESSOR) 100 MG tablet Take 1 tablet by mouth 2 (two) times daily. 07/18/15   [provider]  montelukast (SINGULAIR) 10 MG tablet Take 10 mg by mouth at bedtime as needed (for  allergies).     [provider]  potassium chloride (K-DUR,KLOR-CON) 10 MEQ tablet Take 10 mEq by mouth 2 (two) times daily.    [provider]  predniSONE (DELTASONE) 50 MG tablet 1 tablet daily x 5 days 08/11/18   Coral Spikes, DO  rosuvastatin (CRESTOR) 20 MG tablet Take 20 mg by mouth daily.    [provider]  valsartan-hydrochlorothiazide (DIOVAN-HCT) 320-25 MG tablet Take 1 tablet by mouth daily. 05/16/15   [provider]    Allergies Ace inhibitors, Iodine, Latex, Lotrel [amlodipine besy-benazepril hcl], and Adhesive [tape]  Family History  Problem Relation Age of Onset  . Colon cancer Mother   . Emphysema Father   . Breast cancer Neg Hx     Social History Social History   Tobacco Use  . Smoking status: Never Smoker  . Smokeless tobacco: Never Used  Vaping Use  . Vaping Use: Never used  Substance Use Topics  . Alcohol use: Yes    Comment: occasional  . Drug use: No    Review of Systems Constitutional: No fever/chills Eyes: No visual changes. ENT: No sore throat. Cardiovascular: Denies chest pain. Respiratory: Denies shortness of breath. Gastrointestinal: Positive for abdominal pain, decreased appetite.  Genitourinary: Negative for dysuria. Musculoskeletal: Negative for back pain. Skin: Negative for rash. Neurological: Negative for headaches, focal weakness or numbness.  ____________________________________________   PHYSICAL EXAM:  VITAL SIGNS: ED Triage Vitals  Enc Vitals Group     BP 12/04/20 1847 (!) 90/54     Pulse Rate 12/04/20 1847 87     Resp 12/04/20 1847 19     Temp 12/04/20 1847 98.3 F (36.8 C)     Temp src --      SpO2 12/04/20 1847 96 %     Weight 12/04/20 1835 160 lb (72.6 kg)     Height 12/04/20 1835 5\' 2"  (1.575 m)     Head Circumference --      Peak Flow --      Pain Score 12/04/20 1835 0   Constitutional: Alert and oriented.  Eyes: Conjunctivae are normal.  ENT      Head: Normocephalic  and atraumatic.      Nose: No congestion/rhinnorhea.      Mouth/Throat: Mucous membranes are moist.      Neck: No stridor. Hematological/Lymphatic/Immunilogical: No cervical lymphadenopathy. Cardiovascular: Normal rate, regular rhythm.  No murmurs, rubs, or gallops.  Respiratory: Normal respiratory effort without tachypnea nor retractions. Breath sounds are clear and equal bilaterally. No wheezes/rales/rhonchi. Gastrointestinal: Soft and minimally tender to palpation somewhat diffusely.  Genitourinary: Deferred Musculoskeletal: Normal range of motion in all extremities. No lower extremity edema. Neurologic:  Normal speech and language. No gross focal neurologic deficits are appreciated.  Skin:  Skin is warm, dry and intact. No rash noted. Psychiatric: Mood and affect are normal. Speech and behavior are normal. Patient exhibits appropriate insight and judgment.  ____________________________________________  LABS (pertinent positives/negatives)  CBC wbc 13.8, hgb 12.5, plt 256 BMP na 130, k 3.1, glu 147, cr 2.77  ____________________________________________   EKG  None  ____________________________________________    RADIOLOGY  CT scan obtained as an outpatient reviewed  ____________________________________________   PROCEDURES  Procedures  ____________________________________________   INITIAL IMPRESSION / ASSESSMENT AND PLAN / ED COURSE  Pertinent labs & imaging results that were available during my care of the patient were reviewed by me and considered in my medical decision making (see chart for details).   Patient presented to the emergency department today because of concern for outpatient CT abd/pel which showed diverticulitis with evidence of perforation. Patient with mild leukocytosis here. No significant abdominal tenderness. Will start IV abx and plan on admission. Discussed with patient and family.     ____________________________________________   FINAL CLINICAL IMPRESSION(S) / ED DIAGNOSES  Final diagnoses:  Diverticulitis     Note: This dictation was prepared with Dragon dictation. Any transcriptional errors that result from this process are unintentional     Nance Pear, MD 12/04/20 2041

## 2020-12-04 NOTE — Consult Note (Signed)
  Mild proximal sigmoid diverticulitis is noted with small amount of air seen in the surrounding peritoneal fat consistent with perforation.   I have reviewed the images and concur.    Per Dr Gennette Pac report her appearance is non-toxic.  WBC noted at 13.9  Agree with hospitalist admission for IV Abx and bowel rest, Full consult to follow, and general surgery will follow with you.

## 2020-12-05 ENCOUNTER — Encounter: Payer: Self-pay | Admitting: Internal Medicine

## 2020-12-05 DIAGNOSIS — Z981 Arthrodesis status: Secondary | ICD-10-CM | POA: Diagnosis not present

## 2020-12-05 DIAGNOSIS — K5780 Diverticulitis of intestine, part unspecified, with perforation and abscess without bleeding: Secondary | ICD-10-CM

## 2020-12-05 DIAGNOSIS — E876 Hypokalemia: Secondary | ICD-10-CM

## 2020-12-05 DIAGNOSIS — J439 Emphysema, unspecified: Secondary | ICD-10-CM | POA: Diagnosis present

## 2020-12-05 DIAGNOSIS — I10 Essential (primary) hypertension: Secondary | ICD-10-CM | POA: Diagnosis present

## 2020-12-05 DIAGNOSIS — Z20822 Contact with and (suspected) exposure to covid-19: Secondary | ICD-10-CM | POA: Diagnosis present

## 2020-12-05 DIAGNOSIS — E119 Type 2 diabetes mellitus without complications: Secondary | ICD-10-CM | POA: Diagnosis present

## 2020-12-05 DIAGNOSIS — E871 Hypo-osmolality and hyponatremia: Secondary | ICD-10-CM | POA: Diagnosis present

## 2020-12-05 DIAGNOSIS — Z7982 Long term (current) use of aspirin: Secondary | ICD-10-CM | POA: Diagnosis not present

## 2020-12-05 DIAGNOSIS — Z86010 Personal history of colonic polyps: Secondary | ICD-10-CM | POA: Diagnosis not present

## 2020-12-05 DIAGNOSIS — F0390 Unspecified dementia without behavioral disturbance: Secondary | ICD-10-CM | POA: Diagnosis present

## 2020-12-05 DIAGNOSIS — Z825 Family history of asthma and other chronic lower respiratory diseases: Secondary | ICD-10-CM | POA: Diagnosis not present

## 2020-12-05 DIAGNOSIS — Z7952 Long term (current) use of systemic steroids: Secondary | ICD-10-CM | POA: Diagnosis not present

## 2020-12-05 DIAGNOSIS — E669 Obesity, unspecified: Secondary | ICD-10-CM | POA: Diagnosis present

## 2020-12-05 DIAGNOSIS — Z6834 Body mass index (BMI) 34.0-34.9, adult: Secondary | ICD-10-CM | POA: Diagnosis not present

## 2020-12-05 DIAGNOSIS — E785 Hyperlipidemia, unspecified: Secondary | ICD-10-CM | POA: Diagnosis present

## 2020-12-05 DIAGNOSIS — K56600 Partial intestinal obstruction, unspecified as to cause: Secondary | ICD-10-CM | POA: Diagnosis present

## 2020-12-05 DIAGNOSIS — Z7951 Long term (current) use of inhaled steroids: Secondary | ICD-10-CM | POA: Diagnosis not present

## 2020-12-05 DIAGNOSIS — N179 Acute kidney failure, unspecified: Secondary | ICD-10-CM

## 2020-12-05 DIAGNOSIS — Z79899 Other long term (current) drug therapy: Secondary | ICD-10-CM | POA: Diagnosis not present

## 2020-12-05 DIAGNOSIS — K567 Ileus, unspecified: Secondary | ICD-10-CM | POA: Diagnosis present

## 2020-12-05 DIAGNOSIS — K5720 Diverticulitis of large intestine with perforation and abscess without bleeding: Secondary | ICD-10-CM | POA: Diagnosis present

## 2020-12-05 DIAGNOSIS — K769 Liver disease, unspecified: Secondary | ICD-10-CM | POA: Diagnosis present

## 2020-12-05 DIAGNOSIS — K5792 Diverticulitis of intestine, part unspecified, without perforation or abscess without bleeding: Secondary | ICD-10-CM | POA: Diagnosis present

## 2020-12-05 DIAGNOSIS — Z800 Family history of malignant neoplasm of digestive organs: Secondary | ICD-10-CM | POA: Diagnosis not present

## 2020-12-05 DIAGNOSIS — K8020 Calculus of gallbladder without cholecystitis without obstruction: Secondary | ICD-10-CM | POA: Diagnosis present

## 2020-12-05 LAB — BASIC METABOLIC PANEL
Anion gap: 11 (ref 5–15)
BUN: 42 mg/dL — ABNORMAL HIGH (ref 8–23)
CO2: 28 mmol/L (ref 22–32)
Calcium: 9.4 mg/dL (ref 8.9–10.3)
Chloride: 97 mmol/L — ABNORMAL LOW (ref 98–111)
Creatinine, Ser: 1.92 mg/dL — ABNORMAL HIGH (ref 0.44–1.00)
GFR, Estimated: 26 mL/min — ABNORMAL LOW (ref >60)
Glucose, Bld: 130 mg/dL — ABNORMAL HIGH (ref 70–99)
Potassium: 2.6 mmol/L — CL (ref 3.5–5.1)
Sodium: 136 mmol/L (ref 135–145)

## 2020-12-05 LAB — CBC
HCT: 35.5 % — ABNORMAL LOW (ref 36.0–46.0)
Hemoglobin: 11.5 g/dL — ABNORMAL LOW (ref 12.0–15.0)
MCH: 29.2 pg (ref 26.0–34.0)
MCHC: 32.4 g/dL (ref 30.0–36.0)
MCV: 90.1 fL (ref 80.0–100.0)
Platelets: 214 10*3/uL (ref 150–400)
RBC: 3.94 MIL/uL (ref 3.87–5.11)
RDW: 13.2 % (ref 11.5–15.5)
WBC: 10 10*3/uL (ref 4.0–10.5)
nRBC: 0 % (ref 0.0–0.2)

## 2020-12-05 LAB — POTASSIUM: Potassium: 3.9 mmol/L (ref 3.5–5.1)

## 2020-12-05 MED ORDER — POTASSIUM CHLORIDE 20 MEQ PO PACK
40.0000 meq | PACK | ORAL | Status: AC
  Administered 2020-12-05 (×2): 40 meq via ORAL
  Filled 2020-12-05 (×2): qty 2

## 2020-12-05 MED ORDER — MAGNESIUM SULFATE 2 GM/50ML IV SOLN
2.0000 g | Freq: Once | INTRAVENOUS | Status: AC
  Administered 2020-12-05: 2 g via INTRAVENOUS
  Filled 2020-12-05: qty 50

## 2020-12-05 MED ORDER — ENOXAPARIN SODIUM 30 MG/0.3ML ~~LOC~~ SOLN
30.0000 mg | SUBCUTANEOUS | Status: DC
  Administered 2020-12-05: 30 mg via SUBCUTANEOUS
  Filled 2020-12-05: qty 0.3

## 2020-12-05 MED ORDER — BOOST / RESOURCE BREEZE PO LIQD CUSTOM
1.0000 | Freq: Three times a day (TID) | ORAL | Status: DC
  Administered 2020-12-05 – 2020-12-07 (×4): 1 via ORAL

## 2020-12-05 MED ORDER — PHENOL 1.4 % MT LIQD
1.0000 | Freq: Three times a day (TID) | OROMUCOSAL | Status: DC | PRN
  Administered 2020-12-05: 1 via OROMUCOSAL
  Filled 2020-12-05: qty 177

## 2020-12-05 MED ORDER — ADULT MULTIVITAMIN W/MINERALS CH
1.0000 | ORAL_TABLET | Freq: Every day | ORAL | Status: DC
  Administered 2020-12-06 – 2020-12-07 (×2): 1 via ORAL
  Filled 2020-12-05 (×2): qty 1

## 2020-12-05 MED ORDER — POTASSIUM CHLORIDE IN NACL 20-0.9 MEQ/L-% IV SOLN
INTRAVENOUS | Status: DC
  Filled 2020-12-05 (×7): qty 1000

## 2020-12-05 MED ORDER — PIPERACILLIN-TAZOBACTAM 3.375 G IVPB
3.3750 g | Freq: Three times a day (TID) | INTRAVENOUS | Status: DC
  Administered 2020-12-05 – 2020-12-07 (×6): 3.375 g via INTRAVENOUS
  Filled 2020-12-05 (×5): qty 50

## 2020-12-05 MED ORDER — POTASSIUM CHLORIDE 10 MEQ/100ML IV SOLN
10.0000 meq | INTRAVENOUS | Status: DC
  Administered 2020-12-05: 10 meq via INTRAVENOUS
  Filled 2020-12-05 (×2): qty 100

## 2020-12-05 NOTE — Consult Note (Signed)
PHARMACY CONSULT NOTE  Pharmacy Consult for Electrolyte Monitoring and Replacement   Recent Labs: Potassium (mmol/L)  Date Value  12/05/2020 2.6 (LL)   Magnesium (mg/dL)  Date Value  12/04/2020 1.7   Calcium (mg/dL)  Date Value  12/05/2020 9.4   Albumin (g/dL)  Date Value  04/14/2018 3.9   Sodium (mmol/L)  Date Value  12/05/2020 136    Assessment: Patient is a 80 y/o F with medical history including dementia, HTN, HLD, obesity, history of bilateral lower extremity edema, pulmonary emphysema who is admitted with mild proximal sigmoid diverticulitis with perforation.   Responding well to antibiotics. Advancing diet to CLD today.   MIVF: NS + 20 mEq K/L at 100 mL/hr (48 mEq K+ / day)  Goal of Therapy:  Electrolytes within normal limits  Plan:  4/22 AM labs: Na 136, K 2.6, Scr 1.92 --Replacement initiated with IV potassium runs; however patient experienced significant phlebitis --KCl 40 mEq x 2 doses ordered --Mg of 1.7 yesterday replaced with IV magnesium sulfate 2 g x 1 today --Will re-check potassium at 1800 today --Re-check all other electrolytes tomorrow AM  Erin Mcintosh  12/05/2020 12:32 PM

## 2020-12-05 NOTE — Progress Notes (Signed)
Mobility Specialist - Progress Note   12/05/20 1600  Mobility  Activity Contraindicated/medical hold  Mobility performed by Mobility specialist    Per chart review, pt with low K+ of 2.6 this date, contraindicating mobility. Will attempt session at another date/time as medically appropriate.    Kathee Delton Mobility Specialist 12/05/20, 4:12 PM

## 2020-12-05 NOTE — Consult Note (Signed)
[facility] SURGICAL CONSULTATION NOTE (initial) - cpt: 58099   HISTORY OF PRESENT ILLNESS (HPI):  80 y.o. female presented to [facility] ED last night for evaluation of abdominal pain. It appears patient had initially presented to her PCP (Dr Netty Starring) with complaints of generalized abdominal pain and decrease in her appetite for multiple weeks. She underwent a CT Abdomen/Pelvis which was concerning for mild sigmoid diverticulitis with a small perforation without abscess. As such, she was referred to the ED for evaluation and work up. She denied any associated fever, chills, CP, SOB, emesis, or bladder/bowel changes. No history of diverticulitis in the past. Previous abdominal surgeries are positive for appendectomy and abdominal hysterectomy. Work up in the ED revealed a leukocytosis to 13.8K (now 10.0K), significant AKI with sCr - 2.77 (now 1.92), hypokalemia to 3.1 (now 2.6). She was started on Cipro/Flagyl and admitted to the medicine service.   This morning, she reports that she is feeling better. Her previous abdominal discomfort has resolved. No fever, chills, nausea, emesis.   Surgery is consulted by emergency medicine physician Dr. Gilberto Better, MD in this context for evaluation and management of diverticulitis with small perforation.   PAST MEDICAL HISTORY (PMH):  Past Medical History:  Diagnosis Date  . Asthma   . Diabetes mellitus without complication ([place])   . Diverticulosis   . Hypercholesterolemia   . Hypertension   . Irregular heart rhythm    evaluated by Cardiologist at [place], [place], with stress test and cardiac cath, awaiting records  . Obese   . Spinal stenosis    neck and back  . 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 ABDOMINAL HYSTERECTOMY    . APPENDECTOMY    . BACK SURGERY    . BREAST BIOPSY Right    negative 1970's  .  CARDIAC CATHETERIZATION    . COLONOSCOPY    . COLONOSCOPY WITH PROPOFOL N/A 02/20/2018   Procedure: COLONOSCOPY WITH PROPOFOL;  Surgeon: Lollie Sails, MD;  Location: [facility] ENDOSCOPY;  Service: Endoscopy;  Laterality: N/A;  . ESOPHAGOGASTRODUODENOSCOPY (EGD) WITH PROPOFOL N/A 11/25/2015   Procedure: ESOPHAGOGASTRODUODENOSCOPY (EGD) WITH PROPOFOL;  Surgeon: Lollie Sails, MD;  Location: [facility] ENDOSCOPY;  Service: Endoscopy;  Laterality: N/A;  . EXPLORATION OF SPINAL FUSION    . NECK SURGERY    . TUBAL LIGATION       MEDICATIONS:  Prior to Admission medications   Medication Sig Start Date End Date Taking? Authorizing Provider  acetaminophen (TYLENOL) 500 MG tablet Take 1,000 mg by mouth 2 (two) times daily as needed. Take up to 1,000 mg twice daily as needed for Headache   Yes [provider]  albuterol (PROVENTIL HFA;VENTOLIN HFA) 108 (90 BASE) MCG/ACT inhaler Inhale 1 puff into the lungs every 6 (six) hours as needed for wheezing or shortness of breath.    Yes [provider]  aspirin 81 MG chewable tablet Chew 81 mg by mouth daily.   Yes [provider]  budesonide-formoterol (SYMBICORT) 160-4.5 MCG/ACT inhaler Inhale 2 puffs into the lungs 2 (two) times daily.   Yes [provider]  fluticasone (FLONASE) 50 MCG/ACT nasal spray Place 1 spray into both nostrils daily.   Yes [provider]  folic acid (FOLVITE) 1 MG tablet Take 1 mg by mouth daily.   Yes [provider]  furosemide (LASIX) 20 MG tablet Take 20 mg by mouth daily.  Yes [provider]  hydrALAZINE (APRESOLINE) 25 MG tablet Take 25 mg by mouth 3 (three) times daily.   Yes [provider]  metoprolol (LOPRESSOR) 100 MG tablet Take 1 tablet by mouth 2 (two) times daily. 07/18/15  Yes [provider]  montelukast (SINGULAIR) 10 MG tablet Take 10 mg by mouth at bedtime as needed (for allergies).    Yes [provider]   olmesartan-hydrochlorothiazide (BENICAR HCT) 40-25 MG tablet Take 1 tablet by mouth daily. 11/20/20  Yes [provider]  potassium chloride SA (KLOR-CON) 20 MEQ tablet Take 20 mEq by mouth 2 (two) times daily. 10/22/20  Yes [provider]  rosuvastatin (CRESTOR) 20 MG tablet Take 20 mg by mouth daily.   Yes [provider]  esomeprazole (NEXIUM) 40 MG capsule Take 1 capsule (40 mg total) by mouth daily. 08/21/15 08/20/16  Earleen Newport, MD  Esomeprazole Magnesium (NEXIUM 24HR PO) Take 22.3 mg by mouth daily. OTC Nexium Patient not taking: Reported on 12/04/2020    [provider]  fexofenadine (ALLEGRA) 30 MG tablet Take 30 mg by mouth daily as needed (for allergies).  Patient not taking: Reported on 12/04/2020    [provider]  furosemide (LASIX) 40 MG tablet Take 1 tablet by mouth daily. Patient not taking: Reported on 12/04/2020 05/16/15   [provider]  hydrALAZINE (APRESOLINE) 10 MG tablet Take 1 tablet (10 mg total) by mouth 2 (two) times daily as needed (elevated blood pressure). Patient not taking: No sig reported 10/17/16   Harrie Foreman, MD  LIVALO 2 MG TABS Take 2 mg by mouth daily. Patient not taking: Reported on 12/04/2020 08/13/15   [provider]  Loperamide HCl (IMODIUM PO) Take 1 tablet by mouth daily as needed (for diarrhea). Patient not taking: Reported on 12/04/2020    [provider]  methylcellulose oral powder Take 1 packet by mouth daily. 19gm powder mixed with 238mL fluid Patient not taking: No sig reported    [provider]  predniSONE (DELTASONE) 50 MG tablet 1 tablet daily x 5 days Patient not taking: No sig reported 08/11/18   Coral Spikes, DO  valsartan-hydrochlorothiazide (DIOVAN-HCT) 320-25 MG tablet Take 1 tablet by mouth daily. Patient not taking: Reported on 12/04/2020 05/16/15   [provider]     ALLERGIES:  Allergies  Allergen Reactions  . Ace Inhibitors  Cough  . Iodine Other (See Comments)    Reactions: Eyes swell  . Latex   . Lotrel [Amlodipine Besy-Benazepril Hcl]   . Adhesive [Tape] Rash     SOCIAL HISTORY:  Social History   Socioeconomic History  . Marital status: Married    Spouse name: Not on file  . Number of children: Not on file  . Years of education: Not on file  . Highest education level: Not on file  Occupational History  . Not on file  Tobacco Use  . Smoking status: Never Smoker  . Smokeless tobacco: Never Used  Vaping Use  . Vaping Use: Never used  Substance and Sexual Activity  . Alcohol use: Yes    Comment: occasional  . Drug use: No  . Sexual activity: Not on file  Other Topics Concern  . Not on file  Social History Narrative  . Not on file   Social Determinants of Health   Financial Resource Strain: Not on file  Food Insecurity: Not on file  Transportation Needs: Not on file  Physical Activity: Not on file  Stress:  Not on file  Social Connections: Not on file  Intimate Partner Violence: Not on file     FAMILY HISTORY:  Family History  Problem Relation Age of Onset  . Colon cancer Mother   . Emphysema Father   . Breast cancer Neg Hx       REVIEW OF SYSTEMS:  Review of Systems  Constitutional: Negative for chills and fever.       + Decreased Appetite   HENT: Negative for congestion and sore throat.   Respiratory: Negative for cough and shortness of breath.   Cardiovascular: Negative for chest pain and palpitations.  Gastrointestinal: Positive for abdominal pain (Now resolved). Negative for constipation, diarrhea, nausea and vomiting.  Genitourinary: Negative for dysuria and urgency.  All other systems reviewed and are negative.   VITAL SIGNS:  Temp:  [98.2 F (36.8 C)-98.9 F (37.2 C)] 98.6 F (37 C) (04/22 0306) Pulse Rate:  [72-88] 76 (04/22 0306) Resp:  [16-20] 16 (04/22 0306) BP: (90-118)/(52-84) 99/52 (04/22 0306) SpO2:  [96 %-100 %] 100 % (04/22 0306) Weight:  [72.6  kg-86.1 kg] 86.1 kg (04/21 2313)     Height: 5\' 2"  (157.5 cm) Weight: 86.1 kg BMI (Calculated): 34.71   INTAKE/OUTPUT:  No intake/output data recorded.  PHYSICAL EXAM:  Physical Exam Vitals and nursing note reviewed. Exam conducted with a chaperone present.  Constitutional:      General: She is not in acute distress.    Appearance: Normal appearance. She is obese. She is not ill-appearing.     Comments: Patient resting comfortably, NAD, husband present at bedside  HENT:     Head: Normocephalic and atraumatic.     Mouth/Throat:     Mouth: Mucous membranes are moist.     Pharynx: Oropharynx is clear.  Eyes:     General: No scleral icterus.    Conjunctiva/sclera: Conjunctivae normal.  Cardiovascular:     Rate and Rhythm: Normal rate.     Pulses: Normal pulses.     Heart sounds: No murmur heard.   Pulmonary:     Effort: Pulmonary effort is normal. No respiratory distress.  Abdominal:     General: There is no distension.     Palpations: Abdomen is soft.     Tenderness: There is no abdominal tenderness. There is no guarding or rebound.     Comments: Abdomen is soft, non-tender, non-distended, no rebound/guarding. No peritonitis   Genitourinary:    Comments: Deferred Musculoskeletal:     Right lower leg: No edema.     Left lower leg: No edema.  Skin:    General: Skin is warm and dry.     Coloration: Skin is not pale.     Findings: No erythema.  Neurological:     General: No focal deficit present.     Mental Status: She is alert and oriented to person, place, and time.  Psychiatric:        Mood and Affect: Mood normal.        Behavior: Behavior normal.      Labs:  CBC Latest Ref Rng & Units 12/05/2020 12/04/2020 04/14/2018  WBC 4.0 - 10.5 K/uL 10.0 13.8(H) 6.9  Hemoglobin 12.0 - 15.0 g/dL 11.5(L) 12.5 13.2  Hematocrit 36.0 - 46.0 % 35.5(L) 37.7 40.8  Platelets 150 - 400 K/uL 214 256 268   CMP Latest Ref Rng & Units 12/05/2020 12/04/2020 07/26/2018  Glucose 70 - 99 mg/dL  130(H) 147(H) -  BUN 8 - 23 mg/dL 42(H) 48(H) -  Creatinine  0.44 - 1.00 mg/dL 1.92(H) 2.77(H) 0.90  Sodium 135 - 145 mmol/L 136 130(L) -  Potassium 3.5 - 5.1 mmol/L 2.6(LL) 3.1(L) -  Chloride 98 - 111 mmol/L 97(L) 90(L) -  CO2 22 - 32 mmol/L 28 26 -  Calcium 8.9 - 10.3 mg/dL 9.4 10.1 -  Total Protein 6.5 - 8.1 g/dL - - -  Total Bilirubin 0.3 - 1.2 mg/dL - - -  Alkaline Phos 38 - 126 U/L - - -  AST 15 - 41 U/L - - -  ALT 0 - 44 U/L - - -     Imaging studies:   CT Abdomen/Pelvis (12/04/2020) personally reviewed with very mild changes to the sigmoid colon with a small amount of free air tracking in the mesentery, no abscess, and radiologist report reviewed: IMPRESSION: Mild proximal sigmoid diverticulitis is noted with small amount of air seen in the surrounding peritoneal fat consistent with perforation. No definite abscess or fluid collection is noted. These results will be called to the ordering clinician or representative by the Radiologist Assistant, and communication documented in the PACS or zVision Dashboard.  Several ill-defined faint low densities are noted in the hepatic parenchyma; further evaluation with MRI with and without gadolinium is recommended to evaluate for possible neoplasm.  Cholelithiasis.   Assessment/Plan: (ICD-10's: K64.20) 80 y.o. female with leukocytosis and abdominal discomfort found to have diverticulitis with small perforation without abscess nor peritonitis complicated by significant AKI and hypokalemia    - Appreciate medicine admission  - Certainly needs K+ repletion; monitor closely   - Given her resolution in pain and leukocytosis, I think it is reasonable to initiate CLD today  - No emergent surgical interventions. She understands that should she fail conservative measures or clinically deteriorates, we would need to pursue more urgent interventions including likely colostomy.    - Agree with IV Abx (Cipro + Flagyl)     - Continue IVF  Resuscitation; monitor renal function   - Monitor abdominal examination; on-going bowel function  - Pain control prn; antiemetics prn  - Pending clinical condition, may need repeat imaging in 72 hours if no imrpovements   - Monitor leukocytosis; resolved   - Mobilization as tolerated  - Further management per primary service; we will follow  All of the above findings and recommendations were discussed with the patient (and her husband at bedside), and all of their questions were answered to their expressed satisfaction.  Thank you for the opportunity to participate in this patient's care.   -- Edison Simon, PA-C [facility] 12/05/2020, 7:23 AM [phone] M-F: 7am - 4pm ,

## 2020-12-05 NOTE — Progress Notes (Signed)
Initial Nutrition Assessment  DOCUMENTATION CODES:   Obesity unspecified  INTERVENTION:   Boost Breeze po TID, each supplement provides 250 kcal and 9 grams of protein  Ensure Enlive po BID with diet advancement (strawberry or vanilla), each supplement provides 350 kcal and 20 grams of protein  MVI po daily   Pt at high refeed risk; recommend monitor potassium, magnesium and phosphorus labs daily until stable  Diverticulitis diet education   NUTRITION DIAGNOSIS:   Inadequate oral intake related to acute illness (perforated diverticulitis) as evidenced by per patient/family report.  GOAL:   Patient will meet greater than or equal to 90% of their needs  MONITOR:   PO intake,Supplement acceptance,Diet advancement,Labs,Weight trends,Skin,I & O's  REASON FOR ASSESSMENT:   Malnutrition Screening Tool    ASSESSMENT:   80 y.o. female with medical history significant for hypertension, hyperlipidemia, obesity, bilateral lower extremity edema, pulmonary emphysema and dementia who is admitted with AKI and diverticulitis with perforation.   Met with pt in room today. Pt reports poor appetite and oral intake for 1 week pta r/t nausea and vomiting. Pt reports that she is starting to feel better in hospital. Pt ate 100% of her broth at lunch today. RD discussed with pt the importance of adequate intake needed to preserve lean muscle. Pt reports that she is willing to drink supplements in hospital as long as they are not chocolate. RD will order supplements and vitamins to help pt meet her estimated needs. Pt reports a 10lb weight loss in 3 weeks. Per chart, pt is down 13lbs(6%) in 1 month; this is significant. RD provided pt with diverticulitis diet education today.   RD provided "Nutrition and Diverticulitis" handout with supporting information. Reviewed patient's dietary recall. Provided examples of low and high fiber foods. Discouraged intake of high fiber foods, high fat foods, spicy  foods, processed foods, caffeine and red meats when having a flare. Encouraged pt to cook foods until they are soft and chew foods well to help aid in digestion. Also recommend frequent small meals. Encouraged use of a multi-vitamin and protein supplements while having a flare.    RD encouraged intake of high fiber foods when not having a flare.   Medications reviewed and include: MVI, NaCl w/ KCl @100ml /hr, zosyn   Labs reviewed: K 2.6(L), BUN 42(H), creat 1.92(H)  NUTRITION - FOCUSED PHYSICAL EXAM:  Flowsheet Row Most Recent Value  Orbital Region No depletion  Upper Arm Region No depletion  Thoracic and Lumbar Region No depletion  Buccal Region No depletion  Temple Region No depletion  Clavicle Bone Region No depletion  Clavicle and Acromion Bone Region No depletion  Scapular Bone Region No depletion  Dorsal Hand No depletion  Patellar Region No depletion  Anterior Thigh Region No depletion  Posterior Calf Region No depletion  Edema (RD Assessment) Mild  Hair Reviewed  Eyes Reviewed  Mouth Reviewed  Skin Reviewed  Nails Reviewed     Diet Order:   Diet Order            Diet clear liquid Room service appropriate? Yes; Fluid consistency: Thin  Diet effective now                EDUCATION NEEDS:   Education needs have been addressed  Skin:  Skin Assessment: Reviewed RN Assessment  Last BM:  pta  Height:   Ht Readings from Last 1 Encounters:  12/04/20 5' 2"  (1.575 m)    Weight:   Wt Readings from Last 1  Encounters:  12/04/20 86.1 kg    Ideal Body Weight:  50 kg  BMI:  Body mass index is 34.72 kg/m.  Estimated Nutritional Needs:   Kcal:  1700-2000kcal/day  Protein:  85-100g/day  Fluid:  1.5L/day  Koleen Distance MS, RD, LDN Please refer to [facility] for RD and/or RD on-call/weekend/after hours pager

## 2020-12-05 NOTE — Progress Notes (Signed)
[place] at [place] NAME: Erin Mcintosh    MR#:  [id number]  DATE OF BIRTH:  July 03, 1941  SUBJECTIVE:   Came in with abdominal pain nausea not able to eat well for last three weeks. Husband at bedside. Clear liquid diet started by surgery. Denies abdominal pain. REVIEW OF SYSTEMS:   Review of Systems  Constitutional: Negative for chills, fever and weight loss.  HENT: Negative for ear discharge, ear pain and nosebleeds.   Eyes: Negative for blurred vision, pain and discharge.  Respiratory: Negative for sputum production, shortness of breath, wheezing and stridor.   Cardiovascular: Negative for chest pain, palpitations, orthopnea and PND.  Gastrointestinal: Positive for abdominal pain and nausea. Negative for diarrhea and vomiting.  Genitourinary: Negative for frequency and urgency.  Musculoskeletal: Negative for back pain and joint pain.  Neurological: Negative for sensory change, speech change, focal weakness and weakness.  Psychiatric/Behavioral: Negative for depression and hallucinations. The patient is not nervous/anxious.    Tolerating Diet:yes CLD Tolerating PT:   DRUG ALLERGIES:   Allergies  Allergen Reactions  . Ace Inhibitors Cough  . Iodine Other (See Comments)    Reactions: Eyes swell  . Latex   . Lotrel [Amlodipine Besy-Benazepril Hcl]   . Adhesive [Tape] Rash    VITALS:  Blood pressure 106/72, pulse 81, temperature 98.4 F (36.9 C), temperature source Oral, resp. rate 16, height 5\' 2"  (1.575 m), weight 86.1 kg, SpO2 98 %.  PHYSICAL EXAMINATION:   Physical Exam  GENERAL:  80 y.o.-year-old patient lying in the bed with no acute distress.  LUNGS: Normal breath sounds bilaterally, no wheezing, rales, rhonchi. No use of accessory muscles of respiration.  CARDIOVASCULAR: S1, S2 normal. No murmurs, rubs, or gallops.  ABDOMEN: Soft, nontender, nondistended. Bowel sounds present. No organomegaly or mass.  EXTREMITIES: No  cyanosis, clubbing or edema b/l.    NEUROLOGIC: Cranial nerves II through XII are intact. No focal Motor or sensory deficits b/l.   PSYCHIATRIC:  patient is alert and oriented x 3.  SKIN: No obvious rash, lesion, or ulcer.   LABORATORY PANEL:  CBC Recent Labs  Lab 12/05/20 0437  WBC 10.0  HGB 11.5*  HCT 35.5*  PLT 214    Chemistries  Recent Labs  Lab 12/04/20 1846 12/05/20 0437  NA 130* 136  K 3.1* 2.6*  CL 90* 97*  CO2 26 28  GLUCOSE 147* 130*  BUN 48* 42*  CREATININE 2.77* 1.92*  CALCIUM 10.1 9.4  MG 1.7  --    Cardiac Enzymes No results for input(s): TROPONINI in the last 168 hours. RADIOLOGY:  CT ABDOMEN PELVIS WO CONTRAST  Result Date: 12/04/2020 CLINICAL DATA:  Fatigue, anorexia. EXAM: CT ABDOMEN AND PELVIS WITHOUT CONTRAST TECHNIQUE: Multidetector CT imaging of the abdomen and pelvis was performed following the standard protocol without IV contrast. COMPARISON:  None. FINDINGS: Lower chest: No acute abnormality. Hepatobiliary: Cholelithiasis is noted. No biliary dilatation is noted. Several ill-defined faint low densities are noted in the liver. Pancreas: Unremarkable. No pancreatic ductal dilatation or surrounding inflammatory changes. Spleen: Normal in size without focal abnormality. Adrenals/Urinary Tract: Adrenal glands are unremarkable. Kidneys are normal, without renal calculi, focal lesion, or hydronephrosis. Bladder is unremarkable. Stomach/Bowel: The stomach appears normal. Status post appendectomy. There is no evidence of bowel obstruction. Mild inflammatory changes are seen involving the proximal sigmoid colon consistent with diverticulitis. There is noted free air in the surrounding peritoneal fat consistent with perforation. No fluid collection or abscess  is noted. Vascular/Lymphatic: No significant vascular findings are present. No enlarged abdominal or pelvic lymph nodes. Reproductive: Status post hysterectomy. No adnexal masses. Other: No abdominal wall  hernia or abnormality. No abdominopelvic ascites. Musculoskeletal: No acute or significant osseous findings. IMPRESSION: Mild proximal sigmoid diverticulitis is noted with small amount of air seen in the surrounding peritoneal fat consistent with perforation. No definite abscess or fluid collection is noted. These results will be called to the ordering clinician or representative by the Radiologist Assistant, and communication documented in the PACS or zVision Dashboard. Several ill-defined faint low densities are noted in the hepatic parenchyma; further evaluation with MRI with and without gadolinium is recommended to evaluate for possible neoplasm. Cholelithiasis. Electronically Signed   By: Marijo Conception M.D.   On: 12/04/2020 16:32   ASSESSMENT AND PLAN:   Erin Mcintosh is a 80 y.o. female with medical history significant for hypertension, hyperlipidemia, obesity, history of bilateral lower extremity edema, pulmonary emphysema, presents to the emergency department for chief concerns of CT imaging of diverticulitis with perforation. She reports that she has been having generalized abdominal discomfort and poor appetite for three weeks   Mild proximal sigmoid diverticulitis with contained  perforation - IV zosyn - General surgery Dr Christian Mate  recommends IV antibiotic and bowel rest - CLD - LR IVF  AKI- suspect prerenal secondary to poor p.o. intake Hypokalemia-possibly secondary to AKI in setting of Lasix use - Baseline serum creatinine is 0.9-1.1, GFR greater than 60 -continue IV fluids and PO replacement of potassium  Hepatic lesion- CT abdomen without contrast evidence of hepatic parenchyma showing several ill-defined faint low densities area - Radiologist recommends MRI with and without gadolinium to better evaluate for possible neoplasm - Patient has an acute kidney injury with GFR less than 30 therefore I have not ordered MRI with and without gadolinium   Hypertension-  continue  beta-blockers and hydralazine -- holding Lasix and hydrochlorothiazide   Hyperlipidemia-resumed rosuvastatin    Pulmonary emphysema- resumed home inhalers, including albuterol as needed for wheezing and shortness of breath  Procedures: Family communication : husband at bedside Consults : surgery full CODE STATUS: full DVT Prophylaxis : Lovenox  level of care: Med-Surg Status is: Inpatient  Remains inpatient appropriate because:Inpatient level of care appropriate due to severity of illness   Dispo: The patient is from: Home              Anticipated d/c is to: Home              Patient currently is not medically stable to d/c.   Difficult to place patient No        TOTAL TIME TAKING CARE OF THIS PATIENT: 25 minutes.  >50% time spent on counselling and coordination of care  Note: This dictation was prepared with Dragon dictation along with smaller phrase technology. Any transcriptional errors that result from this process are unintentional.  Erin Mcintosh M.D    [facility]   CC: Primary care physician; [facility] IncPatient ID: Erin Mcintosh, female   DOB: [birth year], 80 y.o.   MRN: [id number]

## 2020-12-05 NOTE — Plan of Care (Signed)

## 2020-12-05 NOTE — Progress Notes (Signed)
Patient's potassium is 2.6. NP Randol Kern notified and orders reviewed.

## 2020-12-06 LAB — BASIC METABOLIC PANEL
Anion gap: 8 (ref 5–15)
BUN: 20 mg/dL (ref 8–23)
CO2: 26 mmol/L (ref 22–32)
Calcium: 9.4 mg/dL (ref 8.9–10.3)
Chloride: 106 mmol/L (ref 98–111)
Creatinine, Ser: 1.16 mg/dL — ABNORMAL HIGH (ref 0.44–1.00)
GFR, Estimated: 48 mL/min — ABNORMAL LOW (ref >60)
Glucose, Bld: 112 mg/dL — ABNORMAL HIGH (ref 70–99)
Potassium: 3.3 mmol/L — ABNORMAL LOW (ref 3.5–5.1)
Sodium: 140 mmol/L (ref 135–145)

## 2020-12-06 LAB — MAGNESIUM: Magnesium: 1.9 mg/dL (ref 1.7–2.4)

## 2020-12-06 LAB — PHOSPHORUS: Phosphorus: 2.1 mg/dL — ABNORMAL LOW (ref 2.5–4.6)

## 2020-12-06 MED ORDER — ENOXAPARIN SODIUM 40 MG/0.4ML ~~LOC~~ SOLN
40.0000 mg | SUBCUTANEOUS | Status: DC
  Administered 2020-12-06: 40 mg via SUBCUTANEOUS
  Filled 2020-12-06: qty 0.4

## 2020-12-06 MED ORDER — POTASSIUM & SODIUM PHOSPHATES 280-160-250 MG PO PACK
2.0000 | PACK | Freq: Three times a day (TID) | ORAL | Status: AC
  Administered 2020-12-06 (×2): 2 via ORAL
  Filled 2020-12-06 (×2): qty 2

## 2020-12-06 MED ORDER — POTASSIUM CHLORIDE CRYS ER 20 MEQ PO TBCR
40.0000 meq | EXTENDED_RELEASE_TABLET | Freq: Once | ORAL | Status: AC
  Administered 2020-12-06: 40 meq via ORAL
  Filled 2020-12-06: qty 2

## 2020-12-06 MED ORDER — MAGNESIUM SULFATE 2 GM/50ML IV SOLN
2.0000 g | Freq: Once | INTRAVENOUS | Status: AC
  Administered 2020-12-06: 2 g via INTRAVENOUS
  Filled 2020-12-06: qty 50

## 2020-12-06 NOTE — Progress Notes (Signed)
Pharmacy Lovenox Dosing  80 y.o. female admitted with abnormal labs . Patient ordered Lovenox 30 mg daily for VTE prophylaxis.   Filed Weights   12/04/20 1835 12/04/20 2313  Weight: 72.6 kg (160 lb) 86.1 kg (189 lb 13.1 oz)    Body mass index is 34.72 kg/m.  Estimated Creatinine Clearance: 40 mL/min (A) (by C-G formula based on SCr of 1.16 mg/dL (H)).  Will adjust Lovenox dosing to 40 mg Q24 hours.   [name] A [name] 12/06/2020 11:25 AM

## 2020-12-06 NOTE — Progress Notes (Signed)
12/06/2020  Subjective: No acute events.  Patient denies any abdominal pain.  Tolerated clear liquids without issues.  WBC normalized yesterday.    Vital signs: Temp:  [97.5 F (36.4 C)-98.2 F (36.8 C)] 98.1 F (36.7 C) (04/23 1116) Pulse Rate:  [72-86] 73 (04/23 1116) Resp:  [16-20] 16 (04/23 1116) BP: (105-117)/(56-69) 114/62 (04/23 1116) SpO2:  [94 %-98 %] 98 % (04/23 1116)   Intake/Output: 04/22 0701 - 04/23 0700 In: 1998.7 [P.O.:180; I.V.:1709.6; IV Piggyback:109.1] Out: -  Last BM Date: 12/05/20  Physical Exam: Constitutional: No acute distress Abdomen:  Soft, non-distended, non-tender to palpation.  Labs:  Recent Labs    12/04/20 1846 12/05/20 0437  WBC 13.8* 10.0  HGB 12.5 11.5*  HCT 37.7 35.5*  PLT 256 214   Recent Labs    12/05/20 0437 12/05/20 1833 12/06/20 0441  NA 136  --  140  K 2.6* 3.9 3.3*  CL 97*  --  106  CO2 28  --  26  GLUCOSE 130*  --  112*  BUN 42*  --  20  CREATININE 1.92*  --  1.16*  CALCIUM 9.4  --  9.4   No results for input(s): LABPROT, INR in the last 72 hours.  Imaging: No results found.  Assessment/Plan: This is a 80 y.o. female with acute diverticulitis with small contained perforation.  --Patient is doing remarkably well.  Denies any pain and is tolerating clear liquids.  Will advance to full liquid diet for breakfast and soft diet for dinner. --Anticipate d/c home tomorrow.   Melvyn Neth, [facility]

## 2020-12-06 NOTE — Consult Note (Signed)
PHARMACY CONSULT NOTE  Pharmacy Consult for Electrolyte Monitoring and Replacement   Recent Labs: Potassium (mmol/L)  Date Value  12/06/2020 3.3 (L)   Magnesium (mg/dL)  Date Value  12/06/2020 1.9   Calcium (mg/dL)  Date Value  12/06/2020 9.4   Albumin (g/dL)  Date Value  04/14/2018 3.9   Phosphorus (mg/dL)  Date Value  12/06/2020 2.1 (L)   Sodium (mmol/L)  Date Value  12/06/2020 140    Assessment: Patient is a 80 y/o F with medical history including dementia, HTN, HLD, obesity, history of bilateral lower extremity edema, pulmonary emphysema who is admitted with mild proximal sigmoid diverticulitis with perforation.    MIVF: NS + 20 mEq K/L at 100 mL/hr (48 mEq K+ / day)  Goal of Therapy:  Electrolytes within normal limits  Plan:  --Potassium 40 mEq PO x 1 --PHOS NAK 2 packets x 2 doses --Mag 2 g IV x 1 --Re-check electrolytes tomorrow AM  Tawnya Crook, PharmD  12/06/2020 7:33 AM

## 2020-12-06 NOTE — Progress Notes (Signed)
[place] at [place] NAME: Erin Mcintosh    MR#:  [id number]  DATE OF BIRTH:  04-11-1941  SUBJECTIVE:   Came in with abdominal pain nausea not able to eat well for last three weeks. Granddaughter at bedside.  Denies abdominal pain. REVIEW OF SYSTEMS:   Review of Systems  Constitutional: Negative for chills, fever and weight loss.  HENT: Negative for ear discharge, ear pain and nosebleeds.   Eyes: Negative for blurred vision, pain and discharge.  Respiratory: Negative for sputum production, shortness of breath, wheezing and stridor.   Cardiovascular: Negative for chest pain, palpitations, orthopnea and PND.  Gastrointestinal: Negative for diarrhea and vomiting.  Genitourinary: Negative for frequency and urgency.  Musculoskeletal: Negative for back pain and joint pain.  Neurological: Negative for sensory change, speech change, focal weakness and weakness.  Psychiatric/Behavioral: Negative for depression and hallucinations. The patient is not nervous/anxious.    Tolerating Diet:yes CLD Tolerating PT: ambulating in the room by herself  DRUG ALLERGIES:   Allergies  Allergen Reactions  . Ace Inhibitors Cough  . Iodine Other (See Comments)    Reactions: Eyes swell  . Latex   . Lotrel [Amlodipine Besy-Benazepril Hcl]   . Adhesive [Tape] Rash    VITALS:  Blood pressure 114/62, pulse 73, temperature 98.1 F (36.7 C), temperature source Oral, resp. rate 16, height 5\' 2"  (1.575 m), weight 86.1 kg, SpO2 98 %.  PHYSICAL EXAMINATION:   Physical Exam  GENERAL:  80 y.o.-year-old patient lying in the bed with no acute distress.  LUNGS: Normal breath sounds bilaterally, no wheezing, rales, rhonchi. No use of accessory muscles of respiration.  CARDIOVASCULAR: S1, S2 normal. No murmurs, rubs, or gallops.  ABDOMEN: Soft, nontender, nondistended. Bowel sounds present. No organomegaly or mass.  EXTREMITIES: No cyanosis, clubbing or edema b/l.     NEUROLOGIC: Cranial nerves II through XII are intact. No focal Motor or sensory deficits b/l.   PSYCHIATRIC:  patient is alert and oriented x 3.  SKIN: No obvious rash, lesion, or ulcer.   LABORATORY PANEL:  CBC Recent Labs  Lab 12/05/20 0437  WBC 10.0  HGB 11.5*  HCT 35.5*  PLT 214    Chemistries  Recent Labs  Lab 12/06/20 0441  NA 140  K 3.3*  CL 106  CO2 26  GLUCOSE 112*  BUN 20  CREATININE 1.16*  CALCIUM 9.4  MG 1.9   Cardiac Enzymes No results for input(s): TROPONINI in the last 168 hours. RADIOLOGY:  CT ABDOMEN PELVIS WO CONTRAST  Result Date: 12/04/2020 CLINICAL DATA:  Fatigue, anorexia. EXAM: CT ABDOMEN AND PELVIS WITHOUT CONTRAST TECHNIQUE: Multidetector CT imaging of the abdomen and pelvis was performed following the standard protocol without IV contrast. COMPARISON:  None. FINDINGS: Lower chest: No acute abnormality. Hepatobiliary: Cholelithiasis is noted. No biliary dilatation is noted. Several ill-defined faint low densities are noted in the liver. Pancreas: Unremarkable. No pancreatic ductal dilatation or surrounding inflammatory changes. Spleen: Normal in size without focal abnormality. Adrenals/Urinary Tract: Adrenal glands are unremarkable. Kidneys are normal, without renal calculi, focal lesion, or hydronephrosis. Bladder is unremarkable. Stomach/Bowel: The stomach appears normal. Status post appendectomy. There is no evidence of bowel obstruction. Mild inflammatory changes are seen involving the proximal sigmoid colon consistent with diverticulitis. There is noted free air in the surrounding peritoneal fat consistent with perforation. No fluid collection or abscess is noted. Vascular/Lymphatic: No significant vascular findings are present. No enlarged abdominal or pelvic lymph nodes. Reproductive: Status post  hysterectomy. No adnexal masses. Other: No abdominal wall hernia or abnormality. No abdominopelvic ascites. Musculoskeletal: No acute or significant  osseous findings. IMPRESSION: Mild proximal sigmoid diverticulitis is noted with small amount of air seen in the surrounding peritoneal fat consistent with perforation. No definite abscess or fluid collection is noted. These results will be called to the ordering clinician or representative by the Radiologist Assistant, and communication documented in the PACS or zVision Dashboard. Several ill-defined faint low densities are noted in the hepatic parenchyma; further evaluation with MRI with and without gadolinium is recommended to evaluate for possible neoplasm. Cholelithiasis. Electronically Signed   By: Marijo Conception M.D.   On: 12/04/2020 16:32   ASSESSMENT AND PLAN:   Erin Mcintosh is a 80 y.o. female with medical history significant for hypertension, hyperlipidemia, obesity, history of bilateral lower extremity edema, pulmonary emphysema, presents to the emergency department for chief concerns of CT imaging of diverticulitis with perforation. She reports that she has been having generalized abdominal discomfort and poor appetite for three weeks   Mild proximal sigmoid diverticulitis with contained  perforation - IV zosyn - General surgery Dr Christian Mate  recommends IV antibiotic and bowel rest - CLD - 4/23-- diet is advanced to full liquid and then to soft diet per surgery. Overall improving  AKI- suspect prerenal secondary to poor p.o. intake Hypokalemia-possibly secondary to AKI in setting of Lasix use - Baseline serum creatinine is 0.9-1.1, GFR greater than 60 -continue IV fluids and PO replacement of potassium  Hepatic lesion- CT abdomen without contrast evidence of hepatic parenchyma showing several ill-defined faint low densities area - Radiologist recommends MRI with and without gadolinium to better evaluate for possible neoplasm - Patient has an acute kidney injury with GFR less than 30 therefore I have not ordered MRI with and without gadolinium   Hypertension-  continue  beta-blockers and hydralazine -- holding Lasix and hydrochlorothiazide   Hyperlipidemia-resumed rosuvastatin    Pulmonary emphysema- resumed home inhalers, including albuterol as needed for wheezing and shortness of breath  Procedures: Family communication : granddaughter at bedside Consults : surgery full CODE STATUS: full DVT Prophylaxis : Lovenox  level of care: Med-Surg Status is: Inpatient  Remains inpatient appropriate because:Inpatient level of care appropriate due to severity of illness   Dispo: The patient is from: Home              Anticipated d/c is to: Home              Patient currently is not medically stable to d/c.   Difficult to place patient No  If continues to improve tolerated soft diet potential discharge tomorrow      TOTAL TIME TAKING CARE OF THIS PATIENT: 25 minutes.  >50% time spent on counselling and coordination of care  Note: This dictation was prepared with Dragon dictation along with smaller phrase technology. Any transcriptional errors that result from this process are unintentional.  Fritzi Mandes M.D    [facility]   CC: [facility]; [facility] ID: Erin Mcintosh, female   DOB: [birth year], 80 y.o.   MRN: [id number]

## 2020-12-06 NOTE — Progress Notes (Signed)
Mobility Specialist - Progress Note   12/06/20 1600  Mobility  Activity Ambulated in room  Range of Motion/Exercises Right leg;Left leg (AP, SLR, ABD, ISO)  Level of Assistance Minimal assist, patient does 75% or more  Assistive Device Front wheel walker  Distance Ambulated (ft) 25 ft  Mobility Response Tolerated well  Mobility performed by Mobility specialist  $Mobility charge 1 Mobility    Pre-mobility: 79 HR, 98% SpO2 Post-mobility: 79 HR, 97% SpO2   Pt engaged in seated therex prior to OOB activity: ankle pumps x10, straight leg raises x10, abduction x10, hip isometrics x10. Transferred EOB and stood with minA. No AD in use. Pt ambulated in room, very mildly unsteady but no LOB. Occasionally reaches out to furniture for increased steadiness. No dizziness. Denied SOB on RA. Denied exhaustion or fatigue after activity. Denied pain. Pt left in bed, family at bedside.    Kathee Delton Mobility Specialist 12/06/20, 4:32 PM

## 2020-12-07 MED ORDER — AMOXICILLIN-POT CLAVULANATE 875-125 MG PO TABS
1.0000 | ORAL_TABLET | Freq: Two times a day (BID) | ORAL | Status: DC
  Administered 2020-12-07: 1 via ORAL
  Filled 2020-12-07: qty 1

## 2020-12-07 MED ORDER — AMOXICILLIN-POT CLAVULANATE 875-125 MG PO TABS: 1.0000 | ORAL_TABLET | Freq: Two times a day (BID) | ORAL | 0 refills | Status: DC

## 2020-12-07 MED ORDER — ADULT MULTIVITAMIN W/MINERALS CH: 1.0000 | ORAL_TABLET | Freq: Every day | ORAL | 0 refills | Status: AC

## 2020-12-07 MED ORDER — AMOXICILLIN-POT CLAVULANATE 875-125 MG PO TABS: 1.0000 | ORAL_TABLET | Freq: Two times a day (BID) | ORAL | 0 refills | Status: AC

## 2020-12-07 NOTE — Plan of Care (Signed)
Patient had an uneventful shift. No changes in initial assessment.No complaints of pain Vital signs within normal range.Denies any needs at this time. All Safety measures maintained. Care continues.  Problem: Education: Goal: Knowledge of General Education information will improve Description: Including pain rating scale, medication(s)/side effects and non-pharmacologic comfort measures Outcome: Progressing   Problem: Health Behavior/Discharge Planning: Goal: Ability to manage health-related needs will improve Outcome: Progressing   Problem: Clinical Measurements: Goal: Ability to maintain clinical measurements within normal limits will improve Outcome: Progressing Goal: Will remain free from infection Outcome: Progressing Goal: Diagnostic test results will improve Outcome: Progressing Goal: Respiratory complications will improve Outcome: Progressing Goal: Cardiovascular complication will be avoided Outcome: Progressing   Problem: Activity: Goal: Risk for activity intolerance will decrease Outcome: Progressing   Problem: Nutrition: Goal: Adequate nutrition will be maintained Outcome: Progressing   Problem: Coping: Goal: Level of anxiety will decrease Outcome: Progressing   Problem: Elimination: Goal: Will not experience complications related to bowel motility Outcome: Progressing Goal: Will not experience complications related to urinary retention Outcome: Progressing   Problem: Pain Managment: Goal: General experience of comfort will improve Outcome: Progressing   Problem: Safety: Goal: Ability to remain free from injury will improve Outcome: Progressing   Problem: Skin Integrity: Goal: Risk for impaired skin integrity will decrease Outcome: Progressing

## 2020-12-07 NOTE — Progress Notes (Signed)
12/07/2020  Subjective: No acute events overnight.  The patient's diet was advanced to soft diet for dinner and she has done very well with this.  Denies any abdominal pain.  Having bowel function with no complications.  Vital signs: Temp:  [97.8 F (36.6 C)-98.7 F (37.1 C)] 98.7 F (37.1 C) (04/24 0825) Pulse Rate:  [69-75] 69 (04/24 0825) Resp:  [18-20] 18 (04/24 0825) BP: (103-126)/(50-62) 122/59 (04/24 0825) SpO2:  [97 %] 97 % (04/24 0825)   Intake/Output: 04/23 0701 - 04/24 0700 In: 404.5 [P.O.:360; IV Piggyback:44.5] Out: 500 [Urine:500] Last BM Date: 12/05/20  Physical Exam: Constitutional: No acute distress Abdomen: Soft, nondistended, nontender to palpation  Labs:  Recent Labs    12/04/20 1846 12/05/20 0437  WBC 13.8* 10.0  HGB 12.5 11.5*  HCT 37.7 35.5*  PLT 256 214   Recent Labs    12/05/20 0437 12/05/20 1833 12/06/20 0441  NA 136  --  140  K 2.6* 3.9 3.3*  CL 97*  --  106  CO2 28  --  26  GLUCOSE 130*  --  112*  BUN 42*  --  20  CREATININE 1.92*  --  1.16*  CALCIUM 9.4  --  9.4   No results for input(s): LABPROT, INR in the last 72 hours.  Imaging: No results found.  Assessment/Plan: This is a 80 y.o. female with acute diverticulitis with contained perforation.  - Patient continues to do very well from her episode of diverticulitis and she is tolerating a soft diet with no pain at all.  Should be okay to discharge home today from a surgical standpoint.  Would recommend completing a 2-week course of antibiotics and follow-up with Dr. Christian Mate in 2 weeks.   Melvyn Neth, [facility]

## 2020-12-07 NOTE — Discharge Summary (Addendum)
[facility] [place] NAME: Bety Cancel    MR#:  [id number]  DATE OF BIRTH:  1941/04/23  DATE OF ADMISSION:  12/04/2020 ADMITTING PHYSICIAN: Amy N Cox, DO  DATE OF DISCHARGE:   PRIMARY CARE PHYSICIAN: [place]    ADMISSION DIAGNOSIS:  Diverticulitis [K57.92] Diverticulitis large intestine [K57.32]  DISCHARGE DIAGNOSIS:  Partial SBO/Ileus--resolved Acute Diverticulitis with contained perforation  SECONDARY DIAGNOSIS:   Past Medical History:  Diagnosis Date  . Asthma   . Diabetes mellitus without complication ([place])   . Diverticulosis   . Hypercholesterolemia   . Hypertension   . Irregular heart rhythm    evaluated by Cardiologist at [place], [place], with stress test and cardiac cath, awaiting records  . Obese   . Spinal stenosis    neck and back  . 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 She reports that she has been having generalized abdominal discomfort and poor appetite for three weeks  Mild proximal sigmoid diverticulitis with contained  perforation -IV zosyn--change to po augmentin (total 10 days abxs) -General surgery Dr Rodenberg/Dr Kirke Corin input noted recommends IV antibiotic and bowel rest -4/23-- diet is advanced to full liquid and then to soft diet per surgery. Overall improving --4/24--tolerates soft diet. No fever. Overall feels better  AKI-suspect prerenal secondary to poor p.o. intake Hypokalemia-possibly secondary to AKI in setting of Lasix use -Baseline serum creatinine is 0.9-1.1, GFR greater than 60 -recieved IV fluids and PO replacement of  potassium --creat at d/c 1.1  Hepatic lesion-CT abdomen without contrast evidence of hepatic parenchyma showing several ill-defined faint low densities area -Radiologist recommends MRI with and without gadolinium to better evaluate for possible neoplasm -Patient has an acute kidney injury with GFR less than 30 therefore I have not ordered MRI with and without gadolinium--Defer to PCP Dr Lovie Macadamia  Hypertension- continue beta-blockers and hydralazine  Hyperlipidemia-resumed rosuvastatin   Pulmonary emphysema-resumed home inhalers, including albuterol as needed for wheezing and shortness of breath  Family communication : son Akshadha Yanik on the phone Consults : surgery full CODE STATUS: full DVT Prophylaxis : Lovenox  level of care: Med-Surg Status is: Inpatient   Dispo: The patient is from: Home  Anticipated d/c is to: Home  Patient currently is  medically improving and stable to d/c.d/w dr Hampton Abbot today              Difficult to place patient No  Discharge plan d/w pt and recommend f/u dr Luther Bradley and PCP dr Lovie Macadamia. Pt will get MRI abdomen as outpt for evaluation of her hepatic lesion(d/w son leonila emmi) Home BP meds resumed with holding parameters  CONSULTS OBTAINED:  Treatment Team:  Ronny Bacon, MD  DRUG ALLERGIES:   Allergies  Allergen Reactions  . Ace Inhibitors Cough  . Iodine Other (See Comments)    Reactions: Eyes swell  . Latex   . Lotrel [Amlodipine Besy-Benazepril Hcl]   . Adhesive [Tape] Rash    DISCHARGE MEDICATIONS:   Allergies as of 12/07/2020      Reactions   Ace Inhibitors Cough   Iodine Other (See Comments)   Reactions: Eyes swell   Latex    Lotrel [amlodipine Besy-benazepril Hcl]    Adhesive [tape]  Rash      Medication List    STOP taking these medications   fexofenadine 30 MG tablet Commonly known as: ALLEGRA   IMODIUM PO   Livalo 2 MG Tabs Generic drug: Pitavastatin Calcium    methylcellulose oral powder   predniSONE 50 MG tablet Commonly known as: DELTASONE   valsartan-hydrochlorothiazide 320-25 MG tablet Commonly known as: DIOVAN-HCT     TAKE these medications   acetaminophen 500 MG tablet Commonly known as: TYLENOL Take 1,000 mg by mouth 2 (two) times daily as needed. Take up to 1,000 mg twice daily as needed for Headache   albuterol 108 (90 Base) MCG/ACT inhaler Commonly known as: VENTOLIN HFA Inhale 1 puff into the lungs every 6 (six) hours as needed for wheezing or shortness of breath.   aspirin 81 MG chewable tablet Chew 81 mg by mouth daily.   budesonide-formoterol 160-4.5 MCG/ACT inhaler Commonly known as: SYMBICORT Inhale 2 puffs into the lungs 2 (two) times daily.   esomeprazole 40 MG capsule Commonly known as: NexIUM Take 1 capsule (40 mg total) by mouth daily. What changed: Another medication with the same name was removed. Continue taking this medication, and follow the directions you see here.   fluticasone 50 MCG/ACT nasal spray Commonly known as: FLONASE Place 1 spray into both nostrils daily.   folic acid 1 MG tablet Commonly known as: FOLVITE Take 1 mg by mouth daily.   furosemide 20 MG tablet Commonly known as: LASIX Take 20 mg by mouth daily. What changed: Another medication with the same name was removed. Continue taking this medication, and follow the directions you see here.   hydrALAZINE 25 MG tablet Commonly known as: APRESOLINE Take 25 mg by mouth 3 (three) times daily. What changed: Another medication with the same name was removed. Continue taking this medication, and follow the directions you see here. Notes to patient: Hold if your BP <102 systolic   metoprolol tartrate 100 MG tablet Commonly known as: LOPRESSOR Take 1 tablet by mouth 2 (two) times daily. Notes to patient: Hold if HR <55 and BP <120   montelukast 10 MG tablet Commonly known as: SINGULAIR Take 10 mg by mouth at bedtime as needed (for  allergies).   multivitamin with minerals Tabs tablet Take 1 tablet by mouth daily. Start taking on: December 08, 2020   olmesartan-hydrochlorothiazide 40-25 MG tablet Commonly known as: BENICAR HCT Take 1 tablet by mouth daily. Notes to patient: Hold if BP <120   potassium chloride SA 20 MEQ tablet Commonly known as: KLOR-CON Take 20 mEq by mouth 2 (two) times daily.   rosuvastatin 20 MG tablet Commonly known as: CRESTOR Take 20 mg by mouth daily.       If you experience worsening of your admission symptoms, develop shortness of breath, life threatening emergency, suicidal or homicidal thoughts you must seek medical attention immediately by calling 911 or calling your MD immediately  if symptoms less severe.  You Must read complete instructions/literature along with all the possible adverse reactions/side effects for all the Medicines you take and that have been prescribed to you. Take any new Medicines after you have completely understood and accept all the possible adverse reactions/side effects.   Please note  You were cared for by a hospitalist during your hospital stay. If you have any questions about your discharge medications or the care you received while you were in the hospital after you are discharged, you can call the unit and asked to speak with the  hospitalist on call if the hospitalist that took care of you is not available. Once you are discharged, your primary care physician will handle any further medical issues. Please note that NO REFILLS for any discharge medications will be authorized once you are discharged, as it is imperative that you return to your primary care physician (or establish a relationship with a primary care physician if you do not have one) for your aftercare needs so that they can reassess your need for medications and monitor your lab values. Today   SUBJECTIVE   I feel ok. Tolerating po soft diet  VITAL SIGNS:  Blood pressure (!) 122/59, pulse  69, temperature 98.7 F (37.1 C), resp. rate 18, height 5\' 2"  (1.575 m), weight 86.1 kg, SpO2 97 %.  I/O:    Intake/Output Summary (Last 24 hours) at 12/07/2020 1020 Last data filed at 12/07/2020 0228 Gross per 24 hour  Intake 404.47 ml  Output 500 ml  Net -95.53 ml    PHYSICAL EXAMINATION:  GENERAL:  80 y.o.-year-old patient lying in the bed with no acute distress.  LUNGS: Normal breath sounds bilaterally, no wheezing, rales,rhonchi or crepitation. No use of accessory muscles of respiration.  CARDIOVASCULAR: S1, S2 normal. No murmurs, rubs, or gallops.  ABDOMEN: Soft, non-tender, non-distended. Bowel sounds present. No organomegaly or mass.  EXTREMITIES: No pedal edema, cyanosis, or clubbing.  NEUROLOGIC: Cranial nerves II through XII are intact. Muscle strength 5/5 in all extremities. Sensation intact. Gait not checked.  PSYCHIATRIC: The patient is alert and oriented x 3.  SKIN: No obvious rash, lesion, or ulcer.   DATA REVIEW:   CBC  Recent Labs  Lab 12/05/20 0437  WBC 10.0  HGB 11.5*  HCT 35.5*  PLT 214    Chemistries  Recent Labs  Lab 12/06/20 0441  NA 140  K 3.3*  CL 106  CO2 26  GLUCOSE 112*  BUN 20  CREATININE 1.16*  CALCIUM 9.4  MG 1.9    Microbiology Results   Recent Results (from the past 240 hour(s))  Resp Panel by RT-PCR (Flu A&B, Covid) Nasopharyngeal Swab     Status: None   Collection Time: 12/04/20  8:23 PM   Specimen: Nasopharyngeal Swab; Nasopharyngeal(NP) swabs in vial transport medium  Result Value Ref Range Status   SARS Coronavirus 2 by RT PCR NEGATIVE NEGATIVE Final    Comment: (NOTE) SARS-CoV-2 target nucleic acids are NOT DETECTED.  The SARS-CoV-2 RNA is generally detectable in upper respiratory specimens during the acute phase of infection. The lowest concentration of SARS-CoV-2 viral copies this assay can detect is 138 copies/mL. A negative result does not preclude SARS-Cov-2 infection and should not be used as the sole basis  for treatment or other patient management decisions. A negative result may occur with  improper specimen collection/handling, submission of specimen other than nasopharyngeal swab, presence of viral mutation(s) within the areas targeted by this assay, and inadequate number of viral copies(<138 copies/mL). A negative result must be combined with clinical observations, patient history, and epidemiological information. The expected result is Negative.  Fact Sheet for Patients:  EntrepreneurPulse.com.au  Fact Sheet for Healthcare Providers:  IncredibleEmployment.be  This test is no t yet approved or cleared by the [place] FDA and  has been authorized for detection and/or diagnosis of SARS-CoV-2 by FDA under an Emergency Use Authorization (EUA). This EUA will remain  in effect (meaning this test can be used) for the duration of the COVID-19 declaration under Section 564(b)(1) of the Act, 21  U.S.C.section 360bbb-3(b)(1), unless the authorization is terminated  or revoked sooner.       Influenza A by PCR NEGATIVE NEGATIVE Final   Influenza B by PCR NEGATIVE NEGATIVE Final    Comment: (NOTE) The Xpert Xpress SARS-CoV-2/FLU/RSV plus assay is intended as an aid in the diagnosis of influenza from Nasopharyngeal swab specimens and should not be used as a sole basis for treatment. Nasal washings and aspirates are unacceptable for Xpert Xpress SARS-CoV-2/FLU/RSV testing.  Fact Sheet for Patients: EntrepreneurPulse.com.au  Fact Sheet for Healthcare Providers: IncredibleEmployment.be  This test is not yet approved or cleared by the [place] FDA and has been authorized for detection and/or diagnosis of SARS-CoV-2 by FDA under an Emergency Use Authorization (EUA). This EUA will remain in effect (meaning this test can be used) for the duration of the COVID-19 declaration under Section 564(b)(1) of the Act, 21  U.S.C. section 360bbb-3(b)(1), unless the authorization is terminated or revoked.  Performed at [facility], [address], [place], [place] [address]     RADIOLOGY:  No results found.   CODE STATUS:     Code Status Orders  (From admission, onward)         Start     Ordered   12/04/20 2052  Full code  Continuous        12/04/20 2053        Code Status History    This patient has a current code status but no historical code status.   Advance Care Planning Activity       TOTAL TIME TAKING CARE OF THIS PATIENT: *35* minutes.    Fritzi Mandes M.D  [facility]    CC: Primary care physician; [place]

## 2020-12-07 NOTE — Progress Notes (Signed)
Patient and daughter given instructions to make all follow up appointments, when to return for worsening symptoms, prescription given, IV taken out without bleeding, medication education, & discharged via wheelchair. Augmentin & to hold blood pressure medications if SBP<120 education given.

## 2020-12-07 NOTE — Discharge Instructions (Addendum)
Diverticulitis  Diverticulitis is when small pouches in your colon (large intestine) get infected or swollen. This causes pain in the belly (abdomen) and watery poop (diarrhea). These pouches are called diverticula. The pouches form in people who have a condition called diverticulosis. What are the causes? This condition may be caused by poop (stool) that gets trapped in the pouches in your colon. The poop lets germs (bacteria) grow in the pouches. This causes the infection. What increases the risk? You are more likely to get this condition if you have small pouches in your colon. The risk is higher if:  You are overweight or very overweight (obese).  You do not exercise enough.  You drink alcohol.  You smoke or use products with tobacco in them.  You eat a diet that has a lot of red meat such as beef, pork, or lamb.  You eat a diet that does not have enough fiber in it.  You are older than 80 years of age. What are the signs or symptoms?  Pain in the belly. Pain is often on the left side, but it may be in other areas.  Fever and feeling cold.  Feeling like you may vomit.  Vomiting.  Having cramps.  Feeling full.  Changes to how often you poop.  Blood in your poop. How is this treated? Most cases are treated at home by:  Taking over-the-counter pain medicines.  Following a clear liquid diet.  Taking antibiotic medicines.  Resting. Very bad cases may need to be treated at a hospital. This may include:  Not eating or drinking.  Taking prescription pain medicine.  Getting antibiotic medicines through an IV tube.  Getting fluid and food through an IV tube.  Having surgery. When you are feeling better, your doctor may tell you to have a test to check your colon (colonoscopy). Follow these instructions at home: Medicines  Take over-the-counter and prescription medicines only as told by your doctor. These include: ? Antibiotics. ? Pain  medicines. ? Fiber pills. ? Probiotics. ? Stool softeners.  If you were prescribed an antibiotic medicine, take it as told by your doctor. Do not stop taking the antibiotic even if you start to feel better.  Ask your doctor if the medicine prescribed to you requires you to avoid driving or using machinery. Eating and drinking  Follow a diet as told by your doctor.  When you feel better, your doctor may tell you to change your diet. You may need to eat a lot of fiber. Fiber makes it easier to poop (have a bowel movement). Foods with fiber include: ? Berries. ? Beans. ? Lentils. ? Green vegetables.  Avoid eating red meat.   General instructions  Do not use any products that contain nicotine or tobacco, such as cigarettes, e-cigarettes, and chewing tobacco. If you need help quitting, ask your doctor.  Exercise 3 or more times a week. Try to get 30 minutes each time. Exercise enough to sweat and make your heart beat faster.  Keep all follow-up visits as told by your doctor. This is important. Contact a doctor if:  Your pain does not get better.  You are not pooping like normal. Get help right away if:  Your pain gets worse.  Your symptoms do not get better.  Your symptoms get worse very fast.  You have a fever.  You vomit more than one time.  You have poop that is: ? Bloody. ? Black. ? Tarry. Summary  This condition happens  when small pouches in your colon get infected or swollen.  Take medicines only as told by your doctor.  Follow a diet as told by your doctor.  Keep all follow-up visits as told by your doctor. This is important. This information is not intended to replace advice given to you by your health care provider. Make sure you discuss any questions you have with your health care provider. Document Revised: 05/14/2019 Document Reviewed: 05/14/2019 Elsevier Patient Education  2021 [place]  keep log of BP at home and hold BP meds if  your SBP <120

## 2020-12-23 ENCOUNTER — Encounter: Payer: Self-pay | Admitting: Surgery

## 2020-12-23 ENCOUNTER — Ambulatory Visit: Payer: Medicare Other | Admitting: Surgery

## 2020-12-23 ENCOUNTER — Other Ambulatory Visit: Payer: Self-pay

## 2020-12-23 VITALS — BP 120/76 | HR 59 | Temp 98.0°F | Ht 62.0 in | Wt 195.0 lb

## 2020-12-23 DIAGNOSIS — K5720 Diverticulitis of large intestine with perforation and abscess without bleeding: Secondary | ICD-10-CM | POA: Diagnosis not present

## 2020-12-23 NOTE — Progress Notes (Signed)
Surgical Clinic Progress/Follow-up Note   HPI:  80 y.o. Female presents to clinic for microperforated diverticulitis follow-up.  Her hospitalization was fairly brief and quite uneventful.  Her leukocytosis resolved very rapidly, and her pain for which she presented diminished quickly.  She was sent home with Augmentin, and I believe she is completed this course. Patient reports  improvement/resolution of prior issues and has been tolerating regular diet, though her appetite somewhat diminished, with +flatus and normal BM's, denies N/V, fever/chills, CP, or SOB.  Review of Systems:  Constitutional: denies fever/chills  ENT: denies sore throat, hearing problems  Respiratory: denies shortness of breath, wheezing  Cardiovascular: denies chest pain, palpitations  Gastrointestinal: denies abdominal pain, N/V, or diarrhea/and bowel function as per interval history Skin: Denies any other rashes or skin discolorations  Vital Signs:  BP 120/76   Pulse (!) 59   Temp 98 F (36.7 C)   Ht 5\' 2"  (1.575 m)   Wt 195 lb (88.5 kg)   SpO2 95%   BMI 35.67 kg/m    Physical Exam:  Constitutional:  -- Normal/Obese body habitus  -- Awake, alert, and oriented x3  Pulmonary:  -- No crackles -- Equal breath sounds bilaterally -- Breathing non-labored at rest Cardiovascular:  -- S1, S2 present  -- No pericardial rubs  Gastrointestinal:  -- Soft and non-distended, non-tender/with no tenderness to palpation, no guarding/rebound tenderness -- No abdominal masses appreciated, pulsatile or otherwise   Musculoskeletal / Integumentary:  -- Wounds or skin discoloration: None appreciated -- Extremities: B/L UE and LE FROM, hands and feet warm, no edema   Laboratory studies: No new pertinent lab work.  Imaging: No new pertinent imaging available for review   Assessment:  80 y.o. yo Female with a problem list including...  Patient Active Problem List   Diagnosis Date Noted  . Diverticulitis large  intestine 12/04/2020  . Hypo-osmolar hyponatremia 12/04/2020  . Essential hypertension 12/04/2020  . Hyperlipidemia 12/04/2020  . AKI (acute kidney injury) ([place]) 12/04/2020  . Perforation of sigmoid colon due to diverticulitis 12/04/2020  . Leukocytosis 12/04/2020  . Unintentional weight loss 12/04/2020  . Hypokalemia 12/04/2020    presents to clinic for follow-up evaluation of microperforated diverticulitis, progressing well.  Plan:              - return to clinic as needed, instructed to call office if any questions or concerns Anticipating her next evaluation for colonoscopy in 2024.  We reviewed the symptoms that may prompt a call, hoping to avoid revisit to the emergency room.  We reviewed diverticulosis and diverticulitis and what we may do to help prevent another recurrence. Advised to pursue a goal of 25 to 30 g of fiber daily.  Made aware that the majority of this may be through natural sources, but advised to be aware of actual consumption and to ensure minimal consumption by daily supplementation.  Various forms of supplements discussed.  Strongly advised to consume more fluids to ensure adequate hydration, instructed to watch color of urine to determine adequacy of hydration.  Clarity is pursued in urine output, and bowel activity that correlates to significant meal intake.  Patient is to avoid deferring having bowel movements, advised to take the time at the first sign of sensation, typically following meals and in the morning.  Subsequent utilization of MiraLAX to ensure at least daily movement, ideally twice daily bowel movements.  If multiple doses of MiraLAX are necessary utilize them.  All of the above recommendations were discussed with  the patient and patient's family, and all of patient's and family's questions were answered to their expressed satisfaction.  Ronny Bacon, MD, FACS : [facility]. Office: [phone]

## 2020-12-23 NOTE — Patient Instructions (Addendum)
Be sure to take a fiber supplement every day. Be sure to drink plenty of fluids. You may use Miralax if you start to have problems with constipation.   Follow-up with our office as needed.  Please call and ask to speak with a nurse if you develop questions or concerns.   Diverticulitis  Diverticulitis is when small pouches in your colon (large intestine) get infected or swollen. This causes pain in the belly (abdomen) and watery poop (diarrhea). These pouches are called diverticula. The pouches form in people who have a condition called diverticulosis. What are the causes? This condition may be caused by poop (stool) that gets trapped in the pouches in your colon. The poop lets germs (bacteria) grow in the pouches. This causes the infection. What increases the risk? You are more likely to get this condition if you have small pouches in your colon. The risk is higher if:  You are overweight or very overweight (obese).  You do not exercise enough.  You drink alcohol.  You smoke or use products with tobacco in them.  You eat a diet that has a lot of red meat such as beef, pork, or lamb.  You eat a diet that does not have enough fiber in it.  You are older than 80 years of age. What are the signs or symptoms?  Pain in the belly. Pain is often on the left side, but it may be in other areas.  Fever and feeling cold.  Feeling like you may vomit.  Vomiting.  Having cramps.  Feeling full.  Changes to how often you poop.  Blood in your poop. How is this treated? Most cases are treated at home by:  Taking over-the-counter pain medicines.  Following a clear liquid diet.  Taking antibiotic medicines.  Resting. Very bad cases may need to be treated at a hospital. This may include:  Not eating or drinking.  Taking prescription pain medicine.  Getting antibiotic medicines through an IV tube.  Getting fluid and food through an IV tube.  Having surgery. When you are  feeling better, your doctor may tell you to have a test to check your colon (colonoscopy). Follow these instructions at home: Medicines  Take over-the-counter and prescription medicines only as told by your doctor. These include: ? Antibiotics. ? Pain medicines. ? Fiber pills. ? Probiotics. ? Stool softeners.  If you were prescribed an antibiotic medicine, take it as told by your doctor. Do not stop taking the antibiotic even if you start to feel better.  Ask your doctor if the medicine prescribed to you requires you to avoid driving or using machinery. Eating and drinking  Follow a diet as told by your doctor.  When you feel better, your doctor may tell you to change your diet. You may need to eat a lot of fiber. Fiber makes it easier to poop (have a bowel movement). Foods with fiber include: ? Berries. ? Beans. ? Lentils. ? Green vegetables.  Avoid eating red meat.   General instructions  Do not use any products that contain nicotine or tobacco, such as cigarettes, e-cigarettes, and chewing tobacco. If you need help quitting, ask your doctor.  Exercise 3 or more times a week. Try to get 30 minutes each time. Exercise enough to sweat and make your heart beat faster.  Keep all follow-up visits as told by your doctor. This is important. Contact a doctor if:  Your pain does not get better.  You are not pooping like normal.  Get help right away if:  Your pain gets worse.  Your symptoms do not get better.  Your symptoms get worse very fast.  You have a fever.  You vomit more than one time.  You have poop that is: ? Bloody. ? Black. ? Tarry. Summary  This condition happens when small pouches in your colon get infected or swollen.  Take medicines only as told by your doctor.  Follow a diet as told by your doctor.  Keep all follow-up visits as told by your doctor. This is important. This information is not intended to replace advice given to you by your health  care provider. Make sure you discuss any questions you have with your health care provider. Document Revised: 05/14/2019 Document Reviewed: 05/14/2019 Elsevier Patient Education  2021 [employer].

## 2021-08-25 ENCOUNTER — Ambulatory Visit
Admission: EM | Admit: 2021-08-25 | Discharge: 2021-08-25 | Disposition: A | Discharge status: Other Acute Inpatient Hospital | Payer: Medicare Other | Attending: Internal Medicine | Admitting: Internal Medicine

## 2021-08-25 ENCOUNTER — Other Ambulatory Visit: Payer: Self-pay

## 2021-08-25 DIAGNOSIS — U071 COVID-19: Secondary | ICD-10-CM | POA: Diagnosis not present

## 2021-08-25 DIAGNOSIS — J069 Acute upper respiratory infection, unspecified: Secondary | ICD-10-CM | POA: Diagnosis not present

## 2021-08-25 LAB — RESP PANEL BY RT-PCR (FLU A&B, COVID) ARPGX2
Influenza A by PCR: NEGATIVE
Influenza B by PCR: NEGATIVE
SARS Coronavirus 2 by RT PCR: POSITIVE — AB

## 2021-08-25 MED ORDER — MOLNUPIRAVIR 200 MG PO CAPS: 4.0000 | ORAL_CAPSULE | Freq: Two times a day (BID) | ORAL | 0 refills | Status: AC

## 2021-08-25 NOTE — ED Provider Notes (Signed)
[facility]    CSN: [id number] Arrival date & time: 08/25/21  1904      History   Chief Complaint Chief Complaint  Patient presents with   Sore Throat    HPI Erin Mcintosh is a 81 y.o. female who presents with her granddaughter with onset of rhinitis and mild cough 3 days ago with mild wheezing. She does have asthma. Denies having appetite to eat. Denies HA or body aches. Has been fatigued. Denies pain in her throat. Has had 3 covid shots total. Her husband got sick at the same time, but he is sicker. Per her granddaughter who is an ICU nurse, her mother goes to their home to check on them, and called her telling her they looked sick and granddaughter decided to have them be seen tonight.  She denies feeling overly sleepy more than usual. Her husband rapid flu was negative and was sent to the hospital with hypoxia and suspicion of CO2 toxicity.      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    Patient Active Problem List   Diagnosis Date Noted   Diverticulitis large intestine 12/04/2020   Hypo-osmolar hyponatremia 12/04/2020   Essential hypertension 12/04/2020   Hyperlipidemia 12/04/2020   AKI (acute kidney injury) ([place]) 12/04/2020   Perforation of sigmoid colon due to diverticulitis 12/04/2020   Leukocytosis 12/04/2020   Unintentional weight loss 12/04/2020   Hypokalemia 12/04/2020    Past Surgical History:  Procedure Laterality Date   ABDOMINAL HYSTERECTOMY     APPENDECTOMY     BACK SURGERY     BREAST BIOPSY Right    negative 1970's   CARDIAC CATHETERIZATION     COLONOSCOPY     COLONOSCOPY  WITH PROPOFOL N/A 02/20/2018   Procedure: COLONOSCOPY WITH PROPOFOL;  Surgeon: Lollie Sails, MD;  Location: [facility] ENDOSCOPY;  Service: Endoscopy;  Laterality: N/A;   ESOPHAGOGASTRODUODENOSCOPY (EGD) WITH PROPOFOL N/A 11/25/2015   Procedure: ESOPHAGOGASTRODUODENOSCOPY (EGD) WITH PROPOFOL;  Surgeon: Lollie Sails, MD;  Location: [facility] ENDOSCOPY;  Service: Endoscopy;  Laterality: N/A;   EXPLORATION OF SPINAL FUSION     NECK SURGERY     TUBAL LIGATION      OB History   No obstetric history on file.      Home Medications    Prior to Admission medications   Medication Sig Start Date End Date Taking? Authorizing Provider  acetaminophen (TYLENOL) 500 MG tablet Take 1,000 mg by mouth 2 (two) times daily as needed. Take up to 1,000 mg twice daily as needed for Headache   Yes [provider]  albuterol (PROVENTIL HFA;VENTOLIN HFA) 108 (90 BASE) MCG/ACT inhaler Inhale 1 puff into the lungs every 6 (six) hours as needed for wheezing or shortness of breath.    Yes [provider]  aspirin 81 MG chewable tablet Chew 81 mg by mouth daily.   Yes [provider]  budesonide-formoterol (SYMBICORT) 160-4.5 MCG/ACT inhaler Inhale 2 puffs into the lungs 2 (two) times daily.   Yes [provider]  fluticasone (FLONASE) 50 MCG/ACT nasal spray Place 1 spray into  both nostrils daily.   Yes [provider]  folic acid (FOLVITE) 1 MG tablet Take 1 mg by mouth daily.   Yes [provider]  furosemide (LASIX) 20 MG tablet Take 20 mg by mouth daily.   Yes [provider]  hydrALAZINE (APRESOLINE) 25 MG tablet Take 25 mg by mouth 3 (three) times daily.   Yes [provider]  metoprolol tartrate (LOPRESSOR) 50 MG tablet Take 50 mg by mouth 2 (two) times daily.   Yes [provider]  molnupiravir EUA (LAGEVRIO) 200 MG CAPS capsule Take 4 capsules (800 mg total) by mouth 2 (two) times daily for 5 days. 08/25/21 08/30/21 Yes  Rodriguez-Southworth, Sunday Spillers, PA-C  montelukast (SINGULAIR) 10 MG tablet Take 10 mg by mouth at bedtime as needed (for allergies).    Yes [provider]  Multiple Vitamin (MULTIVITAMIN WITH MINERALS) TABS tablet Take 1 tablet by mouth daily. 12/08/20  Yes Fritzi Mandes, MD  olmesartan-hydrochlorothiazide (BENICAR HCT) 40-25 MG tablet Take 1 tablet by mouth daily. 11/20/20  Yes [provider]  potassium chloride SA (KLOR-CON) 20 MEQ tablet Take 20 mEq by mouth 2 (two) times daily. 10/22/20  Yes [provider]  rosuvastatin (CRESTOR) 20 MG tablet Take 20 mg by mouth daily.   Yes [provider]  esomeprazole (NEXIUM) 40 MG capsule Take 1 capsule (40 mg total) by mouth daily. 08/21/15 08/20/16  Earleen Newport, MD    Family History Family History  Problem Relation Age of Onset   Colon cancer Mother    Emphysema Father    Breast cancer Neg Hx     Social History Social History   Tobacco Use   Smoking status: Never   Smokeless tobacco: Never  Vaping Use   Vaping Use: Never used  Substance Use Topics   Alcohol use: Not Currently    Comment: occasional   Drug use: No     Allergies   Amlodipine, Ace inhibitors, Iodine, Latex, Lotrel [amlodipine besy-benazepril hcl], and Adhesive [tape]   Review of Systems Review of Systems  Constitutional:  Positive for activity change, appetite change and fatigue. Negative for chills, diaphoresis and fever.  HENT:  Positive for congestion, postnasal drip and rhinorrhea. Negative for ear discharge, ear pain, sore throat and trouble swallowing.   Eyes:  Negative for discharge.  Respiratory:  Positive for cough and wheezing. Negative for shortness of breath.   Skin:  Negative for rash.    Physical Exam Triage Vital Signs ED Triage Vitals  Enc Vitals Group     BP 08/25/21 1938 117/70     Pulse Rate 08/25/21 1938 66     Resp 08/25/21 1938 18     Temp 08/25/21 1938 98.7 F (37.1 C)     Temp Source 08/25/21 1938  Oral     SpO2 08/25/21 1938 94 %     Weight 08/25/21 1935 180 lb (81.6 kg)     Height 08/25/21 1935 5\' 2"  (1.575 m)     Head Circumference --      Peak Flow --      Pain Score 08/25/21 1934 8     Pain Loc --      Pain Edu? --      Excl. in [place]? --    No data found.  Updated Vital Signs BP 117/70 (BP Location: Left Arm)    Pulse 66    Temp 98.7 F (37.1 C) (Oral)    Resp 18    Ht 5\' 2"  (  1.575 m)    Wt 180 lb (81.6 kg)    SpO2 94%    BMI 32.92 kg/m   Visual Acuity Right Eye Distance:   Left Eye Distance:   Bilateral Distance:    Right Eye Near:   Left Eye Near:    Bilateral Near:     Physical Exam Physical Exam Vitals signs and nursing note reviewed.  Constitutional:      General: She is not in acute distress.    Appearance: Normal appearance. She is  ill-appearing, but not toxic-appearing or diaphoretic.  HENT: Her eyes are watery looking    Head: Normocephalic.     Right Ear: Tympanic membrane, ear canal and external ear normal.     Left Ear: Tympanic membrane, ear canal and external ear normal.     Nose: with clear rhinitis    Mouth/Throat: clear    Mouth: Mucous membranes are moist.  Eyes:     General: No scleral icterus.       Right eye: No discharge.        Left eye: No discharge.     Conjunctiva/sclera: Conjunctivae normal.  Neck:     Musculoskeletal: Neck supple. No neck rigidity.  Cardiovascular:     Rate and Rhythm: Normal rate and regular rhythm.     Heart sounds: No murmur.  Pulmonary:     Effort: Pulmonary effort is normal.     Breath sounds: Normal breath sounds.  Musculoskeletal: Normal range of motion.  Lymphadenopathy:     Cervical: No cervical adenopathy.  Skin:    General: Skin is warm and dry.     Coloration: Skin is not jaundiced.     Findings: No rash.  Neurological:     Mental Status: She is alert and oriented to person, place, and time.     Gait: Gait normal.  Psychiatric:        Mood and Affect: Mood normal.        Behavior:  Behavior normal.        Thought Content: Thought content normal.        Judgment: Judgment normal.    UC Treatments / Results  Labs (all labs ordered are listed, but only abnormal results are displayed) Labs Reviewed  RESP PANEL BY RT-PCR (FLU A&B, COVID) ARPGX2 - Abnormal; Notable for the following components:      Result Value   SARS Coronavirus 2 by RT PCR POSITIVE (*)    All other components within normal limits  Flu A&B negative  EKG   Radiology No results found.  Procedures Procedures (including critical care time)  Medications Ordered in UC Medications - No data to display  Initial Impression / Assessment and Plan / UC Course  I have reviewed the triage vital signs and the nursing notes. Pertinent labs  results that were available during my care of the patient were reviewed by me and considered in my medical decision making (see chart for details).  Clinical Course as of 08/25/21 2105  Tue Aug 25, 2021  1957 SpO2: 94 % [SR]    Clinical Course User Index [SR] Rodriguez-Southworth, Sunday Spillers, PA-C  Pt gave me permission to talk with her granddaughter about her results or leave a message which is what I had to do. I sent Molnupiravir since there is no recent BMP since Sept  GD name- Myles Gip [phone]   Final Clinical Impressions(s) / UC Diagnoses   Final diagnoses:  Upper respiratory tract infection, unspecified type  COVID-19 virus infection  Discharge Instructions      I will call you tonight with Covid and Flu results . If the flu test is positive, you need to start the Tamiflu tonight      ED Prescriptions     Medication Sig Dispense Auth. Provider   molnupiravir EUA (LAGEVRIO) 200 MG CAPS capsule Take 4 capsules (800 mg total) by mouth 2 (two) times daily for 5 days. 40 capsule Rodriguez-Southworth, Sunday Spillers, PA-C      PDMP not reviewed this encounter.   Shelby Mattocks, [place] 08/25/21 2106

## 2021-08-25 NOTE — ED Triage Notes (Signed)
Pt c/o sore throat x2days. Pt arrived with her husband as they both have gotten sick at the same time.

## 2021-08-25 NOTE — Discharge Instructions (Signed)
I will call you tonight with Covid and Flu results . If the flu test is positive, you need to start the Tamiflu tonight

## 2021-12-10 ENCOUNTER — Other Ambulatory Visit: Payer: Self-pay | Admitting: Infectious Diseases

## 2021-12-10 DIAGNOSIS — F0390 Unspecified dementia without behavioral disturbance: Secondary | ICD-10-CM

## 2022-12-30 ENCOUNTER — Ambulatory Visit
Admission: EM | Admit: 2022-12-30 | Discharge: 2022-12-30 | Disposition: A | Discharge status: Home / Self Care | Payer: Medicare Other | Attending: Physician Assistant | Admitting: Physician Assistant

## 2022-12-30 ENCOUNTER — Ambulatory Visit: Admit: 2022-12-30 | Payer: Self-pay

## 2022-12-30 ENCOUNTER — Encounter: Payer: Self-pay | Admitting: Emergency Medicine

## 2022-12-30 ENCOUNTER — Ambulatory Visit (INDEPENDENT_AMBULATORY_CARE_PROVIDER_SITE_OTHER): Payer: Medicare Other

## 2022-12-30 DIAGNOSIS — W19XXXA Unspecified fall, initial encounter: Secondary | ICD-10-CM | POA: Diagnosis not present

## 2022-12-30 DIAGNOSIS — S9031XA Contusion of right foot, initial encounter: Secondary | ICD-10-CM | POA: Diagnosis not present

## 2022-12-30 MED ORDER — DICLOFENAC SODIUM 1 % EX GEL: CUTANEOUS | 0 refills | Status: AC

## 2022-12-30 NOTE — ED Provider Notes (Signed)
[facility]    CSN: [id number] Arrival date & time: 12/30/22  1421      History   Chief Complaint Chief Complaint  Patient presents with   Foot Pain    HPI Erin Mcintosh is a 82 y.o. female.   Patient is an 82 year old female who presents with her member with chief plaint of left foot pain and swelling.  Erin Mcintosh reports patient fell at a birthday party on Saturday and was noted to be limping on Sunday.  Since then patient has increasing pain and swelling.  Family also noted some redness to the distal part of the foot.  Patient does have some edema from the ankle down.  Patient and family report that her left leg is always been smaller than her right.  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    Patient Active Problem List   Diagnosis Date Noted   Diverticulitis large intestine 12/04/2020   Hypo-osmolar hyponatremia 12/04/2020   Essential hypertension 12/04/2020   Hyperlipidemia 12/04/2020   AKI (acute kidney injury) ([facility]) 12/04/2020   Perforation of sigmoid colon due to diverticulitis 12/04/2020   Leukocytosis 12/04/2020   Unintentional weight loss 12/04/2020   Hypokalemia 12/04/2020    Past Surgical History:  Procedure Laterality Date   ABDOMINAL HYSTERECTOMY     APPENDECTOMY     BACK SURGERY     BREAST BIOPSY Right    negative 1970's   CARDIAC CATHETERIZATION     COLONOSCOPY     COLONOSCOPY WITH PROPOFOL N/A 02/20/2018   Procedure: COLONOSCOPY WITH PROPOFOL;  Surgeon: Erin Deem, MD;  Location: [facility] ENDOSCOPY;  Service: Endoscopy;   Laterality: N/A;   ESOPHAGOGASTRODUODENOSCOPY (EGD) WITH PROPOFOL N/A 11/25/2015   Procedure: ESOPHAGOGASTRODUODENOSCOPY (EGD) WITH PROPOFOL;  Surgeon: Erin Deem, MD;  Location: [facility] ENDOSCOPY;  Service: Endoscopy;  Laterality: N/A;   EXPLORATION OF SPINAL FUSION     NECK SURGERY     TUBAL LIGATION      OB History   No obstetric history on file.      Home Medications    Prior to Admission medications   Medication Sig Start Date End Date Taking? Authorizing Provider  diclofenac Sodium (VOLTAREN ARTHRITIS PAIN) 1 % GEL Applied gel medication to foot up to 4 times daily.  Use dosing insert as provided with medication to redetermine amount of medication to be applied 12/30/22  Yes Erin Schatz, PA-C  acetaminophen (TYLENOL) 500 MG tablet Take 1,000 mg by mouth 2 (two) times daily as needed. Take up to 1,000 mg twice daily as needed for Headache    [provider]  albuterol (PROVENTIL HFA;VENTOLIN HFA) 108 (90 BASE) MCG/ACT inhaler Inhale 1 puff into the lungs every 6 (six) hours as needed for wheezing or shortness of breath.     [provider]  aspirin 81 MG chewable tablet Chew 81 mg by mouth daily.    [provider]  budesonide-formoterol (SYMBICORT) 160-4.5 MCG/ACT inhaler Inhale 2 puffs into the lungs 2 (two) times daily.  [provider]  esomeprazole (NEXIUM) 40 MG capsule Take 1 capsule (40 mg total) by mouth daily. 08/21/15 08/20/16  Erin Filbert, MD  fluticasone (FLONASE) 50 MCG/ACT nasal spray Place 1 spray into both nostrils daily.    [provider]  folic acid (FOLVITE) 1 MG tablet Take 1 mg by mouth daily.    [provider]  furosemide (LASIX) 20 MG tablet Take 20 mg by mouth daily.    [provider]  hydrALAZINE (APRESOLINE) 25 MG tablet Take 25 mg by mouth 3 (three) times daily.    [provider]  metoprolol tartrate (LOPRESSOR) 50 MG tablet Take 50 mg by mouth 2 (two) times daily.     [provider]  montelukast (SINGULAIR) 10 MG tablet Take 10 mg by mouth at bedtime as needed (for allergies).     [provider]  Multiple Vitamin (MULTIVITAMIN WITH MINERALS) TABS tablet Take 1 tablet by mouth daily. 12/08/20   Erin Finner, MD  olmesartan-hydrochlorothiazide (BENICAR HCT) 40-25 MG tablet Take 1 tablet by mouth daily. 11/20/20   [provider]  potassium chloride SA (KLOR-CON) 20 MEQ tablet Take 20 mEq by mouth 2 (two) times daily. 10/22/20   [provider]  rosuvastatin (CRESTOR) 20 MG tablet Take 20 mg by mouth daily.    [provider]    Family History Family History  Problem Relation Age of Onset   Colon cancer Mother    Emphysema Father    Breast cancer Neg Hx     Social History Social History   Tobacco Use   Smoking status: Never   Smokeless tobacco: Never  Vaping Use   Vaping Use: Never used  Substance Use Topics   Alcohol use: Not Currently    Comment: occasional   Drug use: No     Allergies   Amlodipine, Ace inhibitors, Amlodipine besy-benazepril hcl, Iodine, Latex, Lotrel [amlodipine besy-benazepril hcl], and Adhesive [tape]   Review of Systems Review of Systems as noted above in HPI.  Other systems reviewed and found to be negative   Physical Exam Triage Vital Signs ED Triage Vitals [12/30/22 1444]  Enc Vitals Group     BP (!) 140/96     Pulse Rate 81     Resp 16     Temp 98 F (36.7 C)     Temp Source Oral     SpO2 99 %     Weight      Height      Head Circumference      Peak Flow      Pain Score      Pain Loc      Pain Edu?      Excl. in [place]?    No data found.  Updated Vital Signs BP (!) 140/96 (BP Location: Left Arm)   Pulse 81   Temp 98 F (36.7 C) (Oral)   Resp 16   SpO2 99%   Visual Acuity Right Eye Distance:   Left Eye Distance:   Bilateral Distance:    Right Eye Near:   Left Eye Near:    Bilateral Near:     Physical Exam Constitutional:      Appearance:  Normal appearance.  HENT:     Head: Normocephalic and atraumatic.  Musculoskeletal:       Feet:  Neurological:     General: No focal deficit present.     Mental Status: She is alert and oriented to person, place, and time.  Psychiatric:        Mood and Affect: Mood normal.        Behavior: Behavior normal.      UC Treatments / Results  Labs (all labs ordered are listed, but only abnormal results are displayed) Labs Reviewed - No data to display  EKG   Radiology DG Foot Complete Right  Result Date: 12/30/2022 CLINICAL DATA:  Fall EXAM: RIGHT FOOT COMPLETE - 3+ VIEW COMPARISON:  None Available. FINDINGS: There is diffuse soft tissue swelling of the foot. No acute fracture or dislocation identified. Os perineum noted. Joint spaces are well maintained. There are plantar and posterior calcaneal spurs. IMPRESSION: Diffuse soft tissue swelling of the foot. No acute fracture or dislocation identified. Electronically Signed   By: Darliss Cheney M.D.   On: 12/30/2022 15:36    Procedures Procedures (including critical care time)  Medications Ordered in UC Medications - No data to display  Initial Impression / Assessment and Plan / UC Course  I have reviewed the triage vital signs and the nursing notes.  Pertinent labs & imaging results that were available during my care of the patient were reviewed by me and considered in my medical decision making (see chart for details).     Patient is a 82 year old female who presents with pain to her right foot after a fall on Saturday.  Increase in pain over the last couple days as well as swelling.  Foot is tender over the lateral aspect of the back of the foot.  Some warmth and redness to the area.  No palpation pain to the toes.  Patient on aspirin 81 mg daily no other blood thinners.  Last creatinine 0.9 back in December.  X-rays negative for any acute fracture but does note the soft tissue swelling.  Will give patient prescription for Voltaren  gel.  Also recommend ibuprofen and Tylenol for pain.  Rest and elevation. Final Clinical Impressions(s) / UC Diagnoses   Final diagnoses:  Contusion of right foot, initial encounter  Fall, initial encounter     Discharge Instructions      -X-ray was negative for any fracture.  Most likely sprain to that midfoot -Voltaren gel: Every 6 hours for pain and swelling.  Use package insert to determine how much medication to place -Cough use Tylenol for pain -Rest and elevate foot.  Can also use Epsom salt soaks for the swelling. -Should the swelling and redness continue to worsen, return to clinic for follow-up evaluation.     ED Prescriptions     Medication Sig Dispense Auth. Provider   diclofenac Sodium (VOLTAREN ARTHRITIS PAIN) 1 % GEL Applied gel medication to foot up to 4 times daily.  Use dosing insert as provided with medication to redetermine amount of medication to be applied 150 g Erin Schatz, PA-C      PDMP not reviewed this encounter.   Erin Schatz, PA-C 12/30/22 1545

## 2022-12-30 NOTE — ED Triage Notes (Signed)
Pt presents with right foot pain and swelling x 5 days. Patients granddaughter was advised that she fell, but patient states she has not fallen. She has taken OTC medication for the pain.

## 2022-12-30 NOTE — Discharge Instructions (Addendum)
-  X-ray was negative for any fracture.  Most likely sprain to that midfoot -Voltaren gel: Every 6 hours for pain and swelling.  Use package insert to determine how much medication to place -Cough use Tylenol for pain -Rest and elevate foot.  Can also use Epsom salt soaks for the swelling. -Should the swelling and redness continue to worsen, return to clinic for follow-up evaluation.

## 2022-12-31 ENCOUNTER — Ambulatory Visit: Payer: Self-pay

## 2023-11-30 ENCOUNTER — Emergency Department

## 2023-11-30 ENCOUNTER — Observation Stay
Admission: EM | Admit: 2023-11-30 | Discharge: 2023-11-30 | Disposition: A | Discharge status: Home / Self Care | Attending: Internal Medicine | Admitting: Internal Medicine

## 2023-11-30 DIAGNOSIS — Z7982 Long term (current) use of aspirin: Secondary | ICD-10-CM | POA: Insufficient documentation

## 2023-11-30 DIAGNOSIS — E119 Type 2 diabetes mellitus without complications: Secondary | ICD-10-CM | POA: Diagnosis not present

## 2023-11-30 DIAGNOSIS — R627 Adult failure to thrive: Secondary | ICD-10-CM

## 2023-11-30 DIAGNOSIS — E785 Hyperlipidemia, unspecified: Secondary | ICD-10-CM | POA: Insufficient documentation

## 2023-11-30 DIAGNOSIS — Z91040 Latex allergy status: Secondary | ICD-10-CM | POA: Insufficient documentation

## 2023-11-30 DIAGNOSIS — J45909 Unspecified asthma, uncomplicated: Secondary | ICD-10-CM | POA: Insufficient documentation

## 2023-11-30 DIAGNOSIS — R29810 Facial weakness: Principal | ICD-10-CM | POA: Insufficient documentation

## 2023-11-30 DIAGNOSIS — E876 Hypokalemia: Secondary | ICD-10-CM | POA: Insufficient documentation

## 2023-11-30 DIAGNOSIS — I160 Hypertensive urgency: Secondary | ICD-10-CM | POA: Diagnosis not present

## 2023-11-30 DIAGNOSIS — Z79899 Other long term (current) drug therapy: Secondary | ICD-10-CM | POA: Diagnosis not present

## 2023-11-30 DIAGNOSIS — I10 Essential (primary) hypertension: Secondary | ICD-10-CM | POA: Insufficient documentation

## 2023-11-30 DIAGNOSIS — R2990 Unspecified symptoms and signs involving the nervous system: Principal | ICD-10-CM | POA: Diagnosis present

## 2023-11-30 LAB — COMPREHENSIVE METABOLIC PANEL WITH GFR
ALT: 12 U/L (ref 0–44)
AST: 17 U/L (ref 15–41)
Albumin: 4 g/dL (ref 3.5–5.0)
Alkaline Phosphatase: 68 U/L (ref 38–126)
Anion gap: 6 (ref 5–15)
BUN: 13 mg/dL (ref 8–23)
CO2: 28 mmol/L (ref 22–32)
Calcium: 10.2 mg/dL (ref 8.9–10.3)
Chloride: 106 mmol/L (ref 98–111)
Creatinine, Ser: 0.82 mg/dL (ref 0.44–1.00)
GFR, Estimated: >60 mL/min (ref >60)
Glucose, Bld: 103 mg/dL — ABNORMAL HIGH (ref 70–99)
Potassium: 3.2 mmol/L — ABNORMAL LOW (ref 3.5–5.1)
Sodium: 140 mmol/L (ref 135–145)
Total Bilirubin: 1.4 mg/dL — ABNORMAL HIGH (ref 0.0–1.2)
Total Protein: 8 g/dL (ref 6.5–8.1)

## 2023-11-30 LAB — DIFFERENTIAL
Abs Immature Granulocytes: 0.02 10*3/uL (ref 0.00–0.07)
Basophils Absolute: 0 10*3/uL (ref 0.0–0.1)
Basophils Relative: 0 %
Eosinophils Absolute: 0.3 10*3/uL (ref 0.0–0.5)
Eosinophils Relative: 4 %
Immature Granulocytes: 0 %
Lymphocytes Relative: 23 %
Lymphs Abs: 1.6 10*3/uL (ref 0.7–4.0)
Monocytes Absolute: 0.4 10*3/uL (ref 0.1–1.0)
Monocytes Relative: 6 %
Neutro Abs: 4.6 10*3/uL (ref 1.7–7.7)
Neutrophils Relative %: 67 %

## 2023-11-30 LAB — CBC
HCT: 45.5 % (ref 36.0–46.0)
Hemoglobin: 14.7 g/dL (ref 12.0–15.0)
MCH: 30.7 pg (ref 26.0–34.0)
MCHC: 32.3 g/dL (ref 30.0–36.0)
MCV: 95 fL (ref 80.0–100.0)
Platelets: 252 10*3/uL (ref 150–400)
RBC: 4.79 MIL/uL (ref 3.87–5.11)
RDW: 13.7 % (ref 11.5–15.5)
WBC: 6.9 10*3/uL (ref 4.0–10.5)
nRBC: 0 % (ref 0.0–0.2)

## 2023-11-30 LAB — URINALYSIS, ROUTINE W REFLEX MICROSCOPIC
Bilirubin Urine: NEGATIVE
Glucose, UA: NEGATIVE mg/dL
Hgb urine dipstick: NEGATIVE
Ketones, ur: NEGATIVE mg/dL
Leukocytes,Ua: NEGATIVE
Nitrite: NEGATIVE
Protein, ur: NEGATIVE mg/dL
Specific Gravity, Urine: 1.005 (ref 1.005–1.030)
pH: 8 (ref 5.0–8.0)

## 2023-11-30 LAB — URINE DRUG SCREEN, QUALITATIVE (ARMC ONLY)
Amphetamines, Ur Screen: NOT DETECTED
Barbiturates, Ur Screen: NOT DETECTED
Benzodiazepine, Ur Scrn: NOT DETECTED
Cannabinoid 50 Ng, Ur ~~LOC~~: NOT DETECTED
Cocaine Metabolite,Ur ~~LOC~~: NOT DETECTED
MDMA (Ecstasy)Ur Screen: NOT DETECTED
Methadone Scn, Ur: NOT DETECTED
Opiate, Ur Screen: NOT DETECTED
Phencyclidine (PCP) Ur S: NOT DETECTED
Tricyclic, Ur Screen: NOT DETECTED

## 2023-11-30 LAB — APTT: aPTT: 29 s (ref 24–36)

## 2023-11-30 LAB — PROTIME-INR
INR: 1.1 (ref 0.8–1.2)
Prothrombin Time: 14.3 s (ref 11.4–15.2)

## 2023-11-30 LAB — ETHANOL: Alcohol, Ethyl (B): <10 mg/dL

## 2023-11-30 MED ORDER — SENNOSIDES-DOCUSATE SODIUM 8.6-50 MG PO TABS: 1.0000 | ORAL_TABLET | Freq: Every evening | ORAL | Status: DC | PRN

## 2023-11-30 MED ORDER — ACETAMINOPHEN 325 MG RE SUPP: 650.0000 mg | RECTAL | Status: DC | PRN

## 2023-11-30 MED ORDER — HYDRALAZINE HCL 20 MG/ML IJ SOLN: 5.0000 mg | INTRAMUSCULAR | Status: DC | PRN

## 2023-11-30 MED ORDER — ACETAMINOPHEN 325 MG PO TABS: 650.0000 mg | ORAL_TABLET | ORAL | Status: DC | PRN

## 2023-11-30 MED ORDER — POTASSIUM CITRATE-CITRIC ACID 1100-334 MG/5ML PO SOLN
30.0000 meq | Freq: Once | ORAL | Status: AC
  Administered 2023-11-30: 30 meq via ORAL
  Filled 2023-11-30: qty 15

## 2023-11-30 MED ORDER — STROKE: EARLY STAGES OF RECOVERY BOOK: Freq: Once | Status: AC

## 2023-11-30 MED ORDER — METHYLPREDNISOLONE SODIUM SUCC 40 MG IJ SOLR
40.0000 mg | Freq: Once | INTRAMUSCULAR | Status: AC
  Administered 2023-11-30: 40 mg via INTRAVENOUS
  Filled 2023-11-30: qty 1

## 2023-11-30 MED ORDER — HYDRALAZINE HCL 20 MG/ML IJ SOLN
5.0000 mg | Freq: Four times a day (QID) | INTRAMUSCULAR | Status: DC | PRN
  Filled 2023-11-30: qty 1

## 2023-11-30 MED ORDER — DIPHENHYDRAMINE HCL 25 MG PO CAPS: 50.0000 mg | ORAL_CAPSULE | Freq: Once | ORAL | Status: DC

## 2023-11-30 MED ORDER — ACETAMINOPHEN 160 MG/5ML PO SOLN: 650.0000 mg | ORAL | Status: DC | PRN

## 2023-11-30 MED ORDER — DIPHENHYDRAMINE HCL 50 MG/ML IJ SOLN: 50.0000 mg | Freq: Once | INTRAMUSCULAR | Status: DC

## 2023-11-30 NOTE — ED Provider Notes (Signed)
 [facility] Provider Note    Event Date/Time   First MD Initiated Contact with Patient 11/30/23 1457     (approximate)   History   Facial Droop and Aphasia   HPI  Erin Mcintosh is a 83 y.o. female who presents to the ED for evaluation of Facial Droop and Aphasia   Review of PCP visit from December.  Lives at home with family and refuses all medications prescribed.  Known liver mass, declining memory.  History of HTN, HLD  Patient presents to the ED alongside her husband and daughter for evaluation of 2 episodes of poor responsiveness in the past 24 hours.  Both were witnessed episodes of patient staring off into space, not responding.  She was "out of it, acting like we were not even there."  They estimate each episode lasted about 1 hour.  No rhythmic or seizure activity alongside these episodes.  No fall to the ground or syncope.  She was keeping herself sitting upright during each episode.  She is now back to baseline.  Ambulates independently without assistance device.  Still refusing all prescription medications.   Physical Exam   Triage Vital Signs: ED Triage Vitals  Encounter Vitals Group     BP 11/30/23 1357 (!) 210/111     Systolic BP Percentile --      Diastolic BP Percentile --      Pulse Rate 11/30/23 1357 72     Resp 11/30/23 1357 16     Temp 11/30/23 1357 97.6 F (36.4 C)     Temp Source 11/30/23 1357 Oral     SpO2 11/30/23 1357 94 %     Weight 11/30/23 1358 180 lb (81.6 kg)     Height 11/30/23 1358 5\' 2"  (1.575 m)     Head Circumference --      Peak Flow --      Pain Score 11/30/23 1357 0     Pain Loc --      Pain Education --      Exclude from Growth Chart --     Most recent vital signs: Vitals:   11/30/23 1420 11/30/23 1500  BP: (!) 204/95 (!) 207/88  Pulse: 69 68  Resp:  18  Temp:    SpO2: 96% 98%    General: Awake, no distress.  Pleasantly disoriented.  Follows commands in all 4 extremities without signs of  deficit CV:  Good peripheral perfusion.  Resp:  Normal effort.  Abd:  No distention.  MSK:  No deformity noted.  Neuro:  No focal deficits appreciated. Cranial nerves II through XII intact 5/5 strength and sensation in all 4 extremities Other:     ED Results / Procedures / Treatments   Labs (all labs ordered are listed, but only abnormal results are displayed) Labs Reviewed  COMPREHENSIVE METABOLIC PANEL WITH GFR - Abnormal; Notable for the following components:      Result Value   Potassium 3.2 (*)    Glucose, Bld 103 (*)    Total Bilirubin 1.4 (*)    All other components within normal limits  URINALYSIS, ROUTINE W REFLEX MICROSCOPIC - Abnormal; Notable for the following components:   Color, Urine STRAW (*)    APPearance HAZY (*)    All other components within normal limits  PROTIME-INR  APTT  CBC  DIFFERENTIAL  ETHANOL  URINE DRUG SCREEN, QUALITATIVE ([facility] ONLY)  MAGNESIUM  LIPID PANEL  HEMOGLOBIN A1C    EKG Narrow complex regular rhythm, appears  sinus, rate of 71 bpm.  Leftward axis.  Normal intervals.  No clear signs of acute ischemia.  RADIOLOGY CT head interpreted by me without evidence of acute intracranial pathology  Official radiology report(s): CT HEAD WO CONTRAST Result Date: 11/30/2023 CLINICAL DATA:  83 year old female with episodes of transient left facial droop and aphasia. Hypertensive, 220 systolic. EXAM: CT HEAD WITHOUT CONTRAST TECHNIQUE: Contiguous axial images were obtained from the base of the skull through the vertex without intravenous contrast. RADIATION DOSE REDUCTION: This exam was performed according to the departmental dose-optimization program which includes automated exposure control, adjustment of the mA and/or kV according to patient size and/or use of iterative reconstruction technique. COMPARISON:  Brain MRI 01/03/2009. FINDINGS: Brain: Only mild cerebral volume loss since the 2010 MRI. No midline shift, ventriculomegaly, mass effect,  evidence of mass lesion, intracranial hemorrhage or evidence of cortically based acute infarction. Chronic but progressed, now confluent bilateral cerebral white matter hypodensity with heterogeneity in the bilateral deep gray nuclei somewhat worse on the left. Posterior fossa appears spared. No cortical encephalomalacia identified. Vascular: Calcified atherosclerosis at the skull base. No suspicious intracranial vascular hyperdensity. Skull: Intact.  No acute osseous abnormality identified. Sinuses/Orbits: Visualized paranasal sinuses and mastoids are stable and well aerated. Other: No gaze deviation. Visualized orbits and scalp soft tissues are within normal limits. IMPRESSION: No acute intracranial abnormality. Advanced cerebral white matter changes, lesser heterogeneity in the deep gray nuclei, most compatible with small vessel disease. Electronically Signed   By: Odessa Fleming M.D.   On: 11/30/2023 14:52    PROCEDURES and INTERVENTIONS:  .1-3 Lead EKG Interpretation  Performed by: Delton Prairie, MD Authorized by: Delton Prairie, MD     Interpretation: normal     ECG rate:  70   ECG rate assessment: normal     Rhythm: sinus rhythm     Ectopy: none     Conduction: normal     Medications  diphenhydrAMINE (BENADRYL) capsule 50 mg (has no administration in time range)    Or  diphenhydrAMINE (BENADRYL) injection 50 mg (has no administration in time range)   stroke: early stages of recovery book (has no administration in time range)  acetaminophen (TYLENOL) tablet 650 mg (has no administration in time range)    Or  acetaminophen (TYLENOL) 160 MG/5ML solution 650 mg (has no administration in time range)    Or  acetaminophen (TYLENOL) suppository 650 mg (has no administration in time range)  senna-docusate (Senokot-S) tablet 1 tablet (has no administration in time range)  hydrALAZINE (APRESOLINE) injection 5 mg (has no administration in time range)  methylPREDNISolone sodium succinate (SOLU-MEDROL)  40 mg/mL injection 40 mg (40 mg Intravenous Given 11/30/23 1638)     IMPRESSION / MDM / ASSESSMENT AND PLAN / ED COURSE  I reviewed the triage vital signs and the nursing notes.  Differential diagnosis includes, but is not limited to, TIA, seizure, metabolic encephalopathy, polypharmacy  {Patient presents with symptoms of an acute illness or injury that is potentially life-threatening.  Patient presents with about 2 episodes of well-described poor responsiveness in the past 24 hours.  Presents at baseline and asymptomatic with pleasant dementia.  Hypertensive, chronic, refusing to take all prescription medications at home.  No signs of deficits or abnormalities on my exam.  Clear CT head.  Normal CBC.  Mild hypokalemia.  UA without infectious features.  Awaiting CTA neck/head, iodine allergy pretreatment protocol in process.  I consult with medicine who agrees to admit for observation.  Clinical Course as  of 11/30/23 1707  Wed Nov 30, 2023  1631 Patient has a contrast allergy.  Will require pretreatment protocols.  I update the family of this.  They are agreeable.  [DS]  1653 Consult with medicine who agrees to admit [DS]    Clinical Course User Index [DS] Arline Bennett, MD     FINAL CLINICAL IMPRESSION(S) / ED DIAGNOSES   Final diagnoses:  Stroke-like symptoms     Rx / DC Orders   ED Discharge Orders     None        Note:  This document was prepared using Dragon voice recognition software and may include unintentional dictation errors.   Arline Bennett, MD 11/30/23 [phone]

## 2023-11-30 NOTE — Hospital Course (Addendum)
 [name] is a 83 year old female with history of hypertension, GERD, hyperlipidemia, hypercalcemia secondary to hyperparathyroidism, vitamin D deficiency, memory decline consistent with dementia, who presents emergency department for chief concerns of left-sided facial droop and difficulty speaking for 2 episodes over the course of the last 24 hours.  Vitals in the ED showed temperature of 97.6, respiration rate 16, heart rate 72, blood pressure 210/111, SpO2 of 97% on room air.  Serum sodium is 140, potassium 3.2, chloride 106, bicarb 28, BUN of 13, serum creatinine of 0.82, EGFR greater than 60, nonfasting blood glucose 103, WBC 6.9, hemoglobin 14.7, platelets of 252.  UA was negative for leukocytes and nitrates.  CT head wo contrast: Was read as no acute intracranial abnormality.  Advanced cerebral white matter changes, lesser heterogeneity in deep gray nuclei, most compatible with small vessel disease.  ED treatment: Solu-Medrol 40 mg IV one-time dose, Benadryl p.o./IV one-time dose.

## 2023-11-30 NOTE — IPAL (Signed)
  Interdisciplinary Goals of Care Family Meeting   Date carried out: 11/30/2023  Location of the meeting: Bedside  Member's involved: Physician, Bedside Registered Nurse, and Family Member or next of kin  Durable Power of Attorney or acting medical decision maker: Erin Mcintosh  Discussion: We discussed goals of care for Erin Mcintosh .  Daughter, [name] and [name], granddaughter states that the patient has been adamant before she developed dementia that she would not want a lot to be done.  They have been wanting to discuss with their primary care provider regarding establishing a DNR/DNI documentation however has not been able to get to it.  Family expresses wish that they would like for patient to be DNR/DNI and would like a MOST form to be done before the patient leaves the hospital.  Code status:   Code Status: Limited: Do not attempt resuscitation (DNR) -DNR-LIMITED -Do Not Intubate/DNI    Disposition: Home  Time spent for the meeting: 5 minutes   Dr. Reinhold Carbine  11/30/2023, 7:04 PM

## 2023-11-30 NOTE — ED Notes (Addendum)
 Pts is alert and moving all extremities. Family @bedside  and states she is back to her baseline. Unable to perform NIHSS, Pt has baseline dementia and unable to follow commands easily

## 2023-11-30 NOTE — Assessment & Plan Note (Signed)
 Per outpatient PCP note on 08/26/2022: Patient refusing all medication.  Patient gets blister pack and does not take medication.

## 2023-11-30 NOTE — Assessment & Plan Note (Signed)
 Hydralazine 5 mg IV every 4 hours as needed for SBP greater than 190, 36 hours ordered

## 2023-11-30 NOTE — ED Triage Notes (Addendum)
 Per EMS, Pt, from home, c/o L side facial droop and aphasia x2 episodes.  Family reported first episode last around 2 hours last night and 2nd episode started around 1100 today and lasted around 2 hours.  All symptoms have resolved.   Pt has no complaints.   Pt's BP was 220/112 w/ EMS.  Family reports she was taken off HTN medication in December.   Hx of dementia.

## 2023-11-30 NOTE — Discharge Summary (Signed)
 Physician Discharge Summary  Erin Mcintosh WUJ:[id number] DOB: 06/24/1941 DOA: 11/30/2023  PCP: [facility]  Admit date: 11/30/2023 Discharge date: 11/30/2023  Admitted From: home  Disposition:  home  Recommendations for Outpatient Follow-up:  Follow up with PCP in 1-2 weeks Please obtain BMP/CBC in one week Please follow up on the following pending results:   Discharge Condition: Stable  CODE STATUS: DNR Diet recommendation: Heart Healthy  Brief/Interim Summary:  [name] is an 83 year old female with history of hypertension, GERD, hyperlipidemia, hyperparathyroid, vitamin D deficiency, memory decline consistent with history of dementia, presented to the emergency department for chief concerns of left-sided facial droop and difficulty speaking for 2 episodes in the last 24 hours.  The first episode was last evening, lasted approximately 1 hour and then resolved.  The second episode happened this morning around 11:00 and lasted approximately 2 hours.  The family was very scared and concerned as this is never happened before.  Of note, at least since December 2024, patient has been refusing all her home medications.  She has not taken any of the prescribed medication including her blood pressure medications, aspirin 81 mg daily, cholesterol medications and GERD medications.  After extensive discussion with family at bedside, they did not want further stroke or seizure workup as any positive diagnosis would mean that patient would be prescribed medication and at this time, patient is refusing medications already.  I discussed G-tube placement in order to feed the medications alternatively through the tube instead of by mouth and family declined stating that this is not an option. Granddaughter, Erin Mcintosh is the patient's current caregiver.  Patient's daughter, Erin Mcintosh is also at bedside.  Granddaughter, Erin Mcintosh initially was okay with patient getting  admitted overnight for palliative care consultation so that they can discuss what their options are.  However after discussion amongst the family, they decided that they would like to send the patient home.  They are concerned that overnight she will develop delirium which would cause her to require sedating medications.  And they did not want the patient to have to experience this.  Family declined an MRI, EEG, inpatient palliative care consultation at this time.  They would like their family, Erin Mcintosh to be discharged from the hospital at this time.  Family will call the PCP early in the morning for an emergent same-day visit.  They can then discuss palliative care at that time.  Patient was discharged with palliative care referral sent via [facility].  Discharge Diagnoses:  Principal Problem:   Stroke-like symptom Active Problems:   Essential hypertension   Hyperlipidemia   Hypokalemia   Hypertensive urgency  Discharge Instructions  Discharge Instructions     Amb Referral to Palliative Care   Complete by: As directed    Call MD for:  difficulty breathing, headache or visual disturbances   Complete by: As directed    Call MD for:  persistant nausea and vomiting   Complete by: As directed    Call MD for:  redness, tenderness, or signs of infection (pain, swelling, redness, odor or green/yellow discharge around incision site)   Complete by: As directed    Call MD for:  severe uncontrolled pain   Complete by: As directed    Call MD for:  temperature >100.4   Complete by: As directed    Diet - low sodium heart healthy   Complete by: As directed    Increase activity slowly   Complete by: As  directed       Allergies as of 11/30/2023       Reactions   Amlodipine Other (See Comments)   Ace Inhibitors Cough   Amlodipine Besy-benazepril Hcl Other (See Comments)   Iodine Other (See Comments)   Reactions: Eyes swell   Latex    Lotrel [amlodipine Besy-benazepril Hcl]     Adhesive [tape] Rash        Medication List     STOP taking these medications    furosemide 20 MG tablet Commonly known as: LASIX       TAKE these medications    acetaminophen 500 MG tablet Commonly known as: TYLENOL Take 1,000 mg by mouth 2 (two) times daily as needed. Take up to 1,000 mg twice daily as needed for Headache   albuterol 108 (90 Base) MCG/ACT inhaler Commonly known as: VENTOLIN HFA Inhale 1 puff into the lungs every 6 (six) hours as needed for wheezing or shortness of breath.   aspirin 81 MG chewable tablet Chew 81 mg by mouth daily.   budesonide-formoterol 160-4.5 MCG/ACT inhaler Commonly known as: SYMBICORT Inhale 2 puffs into the lungs 2 (two) times daily.   diclofenac Sodium 1 % Gel Commonly known as: Voltaren Arthritis Pain Applied gel medication to foot up to 4 times daily.  Use dosing insert as provided with medication to redetermine amount of medication to be applied   esomeprazole 40 MG capsule Commonly known as: NexIUM Take 1 capsule (40 mg total) by mouth daily.   fluticasone 50 MCG/ACT nasal spray Commonly known as: FLONASE Place 1 spray into both nostrils daily.   folic acid 1 MG tablet Commonly known as: FOLVITE Take 1 mg by mouth daily.   hydrALAZINE 25 MG tablet Commonly known as: APRESOLINE Take 25 mg by mouth 3 (three) times daily.   metoprolol tartrate 50 MG tablet Commonly known as: LOPRESSOR Take 50 mg by mouth 2 (two) times daily.   montelukast 10 MG tablet Commonly known as: SINGULAIR Take 10 mg by mouth at bedtime as needed (for allergies).   multivitamin with minerals Tabs tablet Take 1 tablet by mouth daily.   olmesartan-hydrochlorothiazide 40-25 MG tablet Commonly known as: BENICAR HCT Take 1 tablet by mouth daily.   potassium chloride SA 20 MEQ tablet Commonly known as: KLOR-CON M Take 20 mEq by mouth 2 (two) times daily.   rosuvastatin 20 MG tablet Commonly known as: CRESTOR Take 20 mg by mouth  daily.        Allergies  Allergen Reactions   Amlodipine Other (See Comments)   Ace Inhibitors Cough   Amlodipine Besy-Benazepril Hcl Other (See Comments)   Iodine Other (See Comments)    Reactions: Eyes swell   Latex    Lotrel [Amlodipine Besy-Benazepril Hcl]    Adhesive [Tape] Rash    Consultations:   Procedures/Studies: CT HEAD WO CONTRAST Result Date: 11/30/2023 CLINICAL DATA:  83 year old female with episodes of transient left facial droop and aphasia. Hypertensive, 220 systolic. EXAM: CT HEAD WITHOUT CONTRAST TECHNIQUE: Contiguous axial images were obtained from the base of the skull through the vertex without intravenous contrast. RADIATION DOSE REDUCTION: This exam was performed according to the departmental dose-optimization program which includes automated exposure control, adjustment of the mA and/or kV according to patient size and/or use of iterative reconstruction technique. COMPARISON:  Brain MRI 01/03/2009. FINDINGS: Brain: Only mild cerebral volume loss since the 2010 MRI. No midline shift, ventriculomegaly, mass effect, evidence of mass lesion, intracranial hemorrhage or evidence of cortically based  acute infarction. Chronic but progressed, now confluent bilateral cerebral white matter hypodensity with heterogeneity in the bilateral deep gray nuclei somewhat worse on the left. Posterior fossa appears spared. No cortical encephalomalacia identified. Vascular: Calcified atherosclerosis at the skull base. No suspicious intracranial vascular hyperdensity. Skull: Intact.  No acute osseous abnormality identified. Sinuses/Orbits: Visualized paranasal sinuses and mastoids are stable and well aerated. Other: No gaze deviation. Visualized orbits and scalp soft tissues are within normal limits. IMPRESSION: No acute intracranial abnormality. Advanced cerebral white matter changes, lesser heterogeneity in the deep gray nuclei, most compatible with small vessel disease. Electronically  Signed   By: Marlise Simpers M.D.   On: 11/30/2023 14:52    Subjective:   Discharge Exam: Vitals:   11/30/23 1715 11/30/23 1806  BP: (!) 159/103   Pulse: 95   Resp: (!) 23   Temp:  97.8 F (36.6 C)  SpO2: 96%    Vitals:   11/30/23 1420 11/30/23 1500 11/30/23 1715 11/30/23 1806  BP: (!) 204/95 (!) 207/88 (!) 159/103   Pulse: 69 68 95   Resp:  18 (!) 23   Temp:    97.8 F (36.6 C)  TempSrc:    Oral  SpO2: 96% 98% 96%   Weight:      Height:       General: Pt is alert, awake, not in acute distress Cardiovascular: RRR, S1/S2 +, no rubs, no gallops Respiratory: CTA bilaterally, no wheezing, no rhonchi Abdominal: Soft, NT, ND, bowel sounds + Extremities: no edema, no cyanosis  The results of significant diagnostics from this hospitalization (including imaging, microbiology, ancillary and laboratory) are listed below for reference.    Labs:  Basic Metabolic Panel: Recent Labs  Lab 11/30/23 1422  NA 140  K 3.2*  CL 106  CO2 28  GLUCOSE 103*  BUN 13  CREATININE 0.82  CALCIUM 10.2   Liver Function Tests: Recent Labs  Lab 11/30/23 1422  AST 17  ALT 12  ALKPHOS 68  BILITOT 1.4*  PROT 8.0  ALBUMIN 4.0   CBC: Recent Labs  Lab 11/30/23 1422  WBC 6.9  NEUTROABS 4.6  HGB 14.7  HCT 45.5  MCV 95.0  PLT 252   Urinalysis    Component Value Date/Time   COLORURINE STRAW (A) 11/30/2023 1557   APPEARANCEUR HAZY (A) 11/30/2023 1557   LABSPEC 1.005 11/30/2023 1557   PHURINE 8.0 11/30/2023 1557   GLUCOSEU NEGATIVE 11/30/2023 1557   HGBUR NEGATIVE 11/30/2023 1557   BILIRUBINUR NEGATIVE 11/30/2023 1557   KETONESUR NEGATIVE 11/30/2023 1557   PROTEINUR NEGATIVE 11/30/2023 1557   NITRITE NEGATIVE 11/30/2023 1557   LEUKOCYTESUR NEGATIVE 11/30/2023 1557   Sepsis Labs Recent Labs  Lab 11/30/23 1422  WBC 6.9   Microbiology No results found for this or any previous visit (from the past 240 hours).  Time coordinating discharge: > 30 minutes  SIGNED:  Dr.  Reinhold Carbine  [facility] 11/30/2023, 6:44 PM Pager   If 7AM-7PM, please contact day-coverage [email or web address]

## 2023-11-30 NOTE — Assessment & Plan Note (Signed)
 CTA head and neck with and without contrast, MRI of the brain without contrast ordered on admission Neurology service not consulted as stroke is not confirmed Fasting lipid and A1c ordered Permissive hypertension: Hydralazine 5 mg every 4 hours as needed for SBP greater than 190 Frequent neuro vascular checks N.p.o. pending swallow screening PT, OT, SLP Fall precaution

## 2023-11-30 NOTE — H&P (Signed)
 History and Physical   Erin Mcintosh ZOX:[id number] DOB: 1941/02/10 DOA: 11/30/2023  PCP: [facility]  Outpatient Specialists: Dr. Jerrel Ivory clinic endocrinology Patient coming from: Home via EMS  I have personally briefly reviewed patient's old medical records in [facility] EMR.  Chief Concern: Left-sided facial droop, expressive aphasia  HPI: [name] is a 83 year old female with history of hypertension, GERD, hyperlipidemia, hypercalcemia secondary to hyperparathyroidism, vitamin D deficiency, memory decline consistent with dementia, who presents emergency department for chief concerns of left-sided facial droop and difficulty speaking for 2 episodes over the course of the last 24 hours.  Vitals in the ED showed temperature of 97.6, respiration rate 16, heart rate 72, blood pressure 210/111, SpO2 of 97% on room air.  Serum sodium is 140, potassium 3.2, chloride 106, bicarb 28, BUN of 13, serum creatinine of 0.82, EGFR greater than 60, nonfasting blood glucose 103, WBC 6.9, hemoglobin 14.7, platelets of 252.  UA was negative for leukocytes and nitrates.  CT head wo contrast: Was read as no acute intracranial abnormality.  Advanced cerebral white matter changes, lesser heterogeneity in deep gray nuclei, most compatible with small vessel disease.  ED treatment: Solu-Medrol 40 mg IV one-time dose, Benadryl p.o./IV one-time dose. ------------------------------ At bedside, patient patient is awake and sitting up in bed.  She is pleasantly demented.  She is able to tell me her first name.  Patient smiles and hugs me.  She is consistently trying to get out of bed.  She does respond to her daughter with redirection. Family states that at bedside, she has been refusing all her medication and this was documented in December 2020 for PCP visit.  They were concerned that she was having a stroke or seizure and they wanted to know if there are any other interventions  besides medications for these things.  Social history: She lives at home with family.  ROS: To complete as patient has advanced dementia  ED Course: Discussed with EDP, patient requiring hospitalization for chief concerns of strokelike symptoms.  Assessment/Plan  Principal Problem:   Stroke-like symptom Active Problems:   Essential hypertension   Hyperlipidemia   Hypokalemia   Hypertensive urgency   Assessment and Plan:  * Stroke-like symptom CTA head and neck with and without contrast, MRI of the brain without contrast ordered on admission Neurology service not consulted as stroke is not confirmed Fasting lipid and A1c ordered Permissive hypertension: Hydralazine 5 mg every 4 hours as needed for SBP greater than 190 Frequent neuro vascular checks N.p.o. pending swallow screening PT, OT, SLP Fall precaution  Hypertensive urgency Hydralazine 5 mg IV every 4 hours as needed for SBP greater than 190, 36 hours ordered  Hypokalemia Serum magnesium level checked on admission Polycitra 30 mill equivalents p.o. one-time dose ordered Recheck BMP in a.m.  Essential hypertension Per outpatient PCP note on 08/26/2022: Patient refusing all medication.  Patient gets blister pack and does not take medication.  Chart reviewed.   DVT prophylaxis: TED hose; AM team to initiate pharmacologic DVT prophylaxis when the benefits outweigh the risk Code Status: DNR/DNI Diet: N.p.o. pending swallow screening Family Communication: Discussed with her and granddaughter, niece over the phone Disposition Plan: Pending clinical course Consults called: None at this time Admission status: Telemetry medical, observation  Past Medical History:  Diagnosis Date   Asthma    Diabetes mellitus without complication (HCC)    Diverticulosis    Hypercholesterolemia    Hypertension    Irregular heart rhythm  evaluated by Cardiologist at [facility] [place], [place], with stress test  and cardiac cath, awaiting records   Obese    Spinal stenosis    neck and back   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   Past Surgical History:  Procedure Laterality Date   ABDOMINAL HYSTERECTOMY     APPENDECTOMY     BACK SURGERY     BREAST BIOPSY Right    negative 1970's   CARDIAC CATHETERIZATION     COLONOSCOPY     COLONOSCOPY WITH PROPOFOL N/A 02/20/2018   Procedure: COLONOSCOPY WITH PROPOFOL;  Surgeon: [name], MD;  Location: [facility] ENDOSCOPY;  Service: Endoscopy;  Laterality: N/A;   ESOPHAGOGASTRODUODENOSCOPY (EGD) WITH PROPOFOL N/A 11/25/2015   Procedure: ESOPHAGOGASTRODUODENOSCOPY (EGD) WITH PROPOFOL;  Surgeon: [name], MD;  Location: [facility] ENDOSCOPY;  Service: Endoscopy;  Laterality: N/A;   EXPLORATION OF SPINAL FUSION     NECK SURGERY     TUBAL LIGATION     Social History:  reports that she has never smoked. She has never used smokeless tobacco. She reports that she does not currently use alcohol. She reports that she does not use drugs.  Allergies  Allergen Reactions   Amlodipine Other (See Comments)   Ace Inhibitors Cough   Amlodipine Besy-Benazepril Hcl Other (See Comments)   Iodine Other (See Comments)    Reactions: Eyes swell   Latex    Lotrel [Amlodipine Besy-Benazepril Hcl]    Adhesive [Tape] Rash   Family History  Problem Relation Age of Onset   Colon cancer Mother    Emphysema Father    Breast cancer Neg Hx    Family history: Family history reviewed and not pertinent.  Prior to Admission medications   Medication Sig Start Date End Date Taking? Authorizing Provider  acetaminophen (TYLENOL) 500 MG tablet Take 1,000 mg by mouth 2 (two) times daily as needed. Take up to 1,000 mg twice daily as needed for Headache    [provider]  albuterol (PROVENTIL HFA;VENTOLIN HFA) 108 (90 BASE) MCG/ACT inhaler Inhale 1 puff into the lungs every 6 (six) hours as needed for wheezing or shortness of breath.     [provider]   aspirin 81 MG chewable tablet Chew 81 mg by mouth daily.    [provider]  budesonide-formoterol (SYMBICORT) 160-4.5 MCG/ACT inhaler Inhale 2 puffs into the lungs 2 (two) times daily.    [provider]  diclofenac Sodium (VOLTAREN ARTHRITIS PAIN) 1 % GEL Applied gel medication to foot up to 4 times daily.  Use dosing insert as provided with medication to redetermine amount of medication to be applied 12/30/22   [name], PA-C  esomeprazole (NEXIUM) 40 MG capsule Take 1 capsule (40 mg total) by mouth daily. 08/21/15 08/20/16  [name], MD  fluticasone (FLONASE) 50 MCG/ACT nasal spray Place 1 spray into both nostrils daily.    [provider]  folic acid (FOLVITE) 1 MG tablet Take 1 mg by mouth daily.    [provider]  furosemide (LASIX) 20 MG tablet Take 20 mg by mouth daily.    [provider]  hydrALAZINE (APRESOLINE) 25 MG tablet Take 25 mg by mouth 3 (three) times daily.    [provider]  metoprolol tartrate (LOPRESSOR) 50 MG tablet Take 50 mg by mouth 2 (two) times daily.    [provider]  montelukast (SINGULAIR) 10 MG tablet Take 10 mg by mouth at bedtime as needed (for allergies).  [provider]  Multiple Vitamin (MULTIVITAMIN WITH MINERALS) TABS tablet Take 1 tablet by mouth daily. 12/08/20   [name], MD  olmesartan-hydrochlorothiazide (BENICAR HCT) 40-25 MG tablet Take 1 tablet by mouth daily. 11/20/20   [provider]  potassium chloride SA (KLOR-CON) 20 MEQ tablet Take 20 mEq by mouth 2 (two) times daily. 10/22/20   [provider]  rosuvastatin (CRESTOR) 20 MG tablet Take 20 mg by mouth daily.    [provider]   Physical Exam: Vitals:   11/30/23 1358 11/30/23 1420 11/30/23 1500 11/30/23 1715  BP:  (!) 204/95 (!) 207/88 (!) 159/103  Pulse:  69 68 95  Resp:   18 (!) 23  Temp:      TempSrc:      SpO2:  96% 98% 96%  Weight: 81.6 kg     Height: 5\' 2"   (1.575 m)      Constitutional: appears frail, age-appropriate, NAD, calm Eyes: PERRL, lids and conjunctivae normal ENMT: Mucous membranes are moist. Posterior pharynx clear of any exudate or lesions. Age-appropriate dentition. Hearing appropriate Neck: normal, supple, no masses, no thyromegaly Respiratory: clear to auscultation bilaterally, no wheezing, no crackles. Normal respiratory effort. No accessory muscle use.  Cardiovascular: Regular rate and rhythm, no murmurs / rubs / gallops. No extremity edema. 2+ pedal pulses. No carotid bruits.  Abdomen: Obese abdomen, no tenderness, no masses palpated, no hepatosplenomegaly. Bowel sounds positive.  Musculoskeletal: no clubbing / cyanosis. No joint deformity upper and lower extremities. Good ROM, no contractures, no atrophy. Normal muscle tone.  Skin: no rashes, lesions, ulcers. No induration Neurologic: Sensation intact. Strength 5/5 in all 4.  Psychiatric: Lacks judgment, insight, orientation.  Patient is happy and mood.  Patient is awake.   EKG: independently reviewed, showing sinus rhythm with rate of 71, QTc 428  Chest x-ray on Admission: I personally reviewed and I agree with radiologist reading as below.  CT HEAD WO CONTRAST Result Date: 11/30/2023 CLINICAL DATA:  83 year old female with episodes of transient left facial droop and aphasia. Hypertensive, 220 systolic. EXAM: CT HEAD WITHOUT CONTRAST TECHNIQUE: Contiguous axial images were obtained from the base of the skull through the vertex without intravenous contrast. RADIATION DOSE REDUCTION: This exam was performed according to the departmental dose-optimization program which includes automated exposure control, adjustment of the mA and/or kV according to patient size and/or use of iterative reconstruction technique. COMPARISON:  Brain MRI 01/03/2009. FINDINGS: Brain: Only mild cerebral volume loss since the 2010 MRI. No midline shift, ventriculomegaly, mass effect, evidence of mass  lesion, intracranial hemorrhage or evidence of cortically based acute infarction. Chronic but progressed, now confluent bilateral cerebral white matter hypodensity with heterogeneity in the bilateral deep gray nuclei somewhat worse on the left. Posterior fossa appears spared. No cortical encephalomalacia identified. Vascular: Calcified atherosclerosis at the skull base. No suspicious intracranial vascular hyperdensity. Skull: Intact.  No acute osseous abnormality identified. Sinuses/Orbits: Visualized paranasal sinuses and mastoids are stable and well aerated. Other: No gaze deviation. Visualized orbits and scalp soft tissues are within normal limits. IMPRESSION: No acute intracranial abnormality. Advanced cerebral white matter changes, lesser heterogeneity in the deep gray nuclei, most compatible with small vessel disease. Electronically Signed   By: Odessa Fleming M.D.   On: 11/30/2023 14:52   Labs on Admission: I have personally reviewed following labs  CBC: Recent Labs  Lab 11/30/23 1422  WBC 6.9  NEUTROABS 4.6  HGB 14.7  HCT 45.5  MCV 95.0  PLT 252   Basic Metabolic  Panel: Recent Labs  Lab 11/30/23 1422  NA 140  K 3.2*  CL 106  CO2 28  GLUCOSE 103*  BUN 13  CREATININE 0.82  CALCIUM 10.2   GFR: Estimated Creatinine Clearance: 52.4 mL/min (by C-G formula based on SCr of 0.82 mg/dL).  Liver Function Tests: Recent Labs  Lab 11/30/23 1422  AST 17  ALT 12  ALKPHOS 68  BILITOT 1.4*  PROT 8.0  ALBUMIN 4.0   Coagulation Profile: Recent Labs  Lab 11/30/23 1422  INR 1.1   Urine analysis:    Component Value Date/Time   COLORURINE STRAW (A) 11/30/2023 1557   APPEARANCEUR HAZY (A) 11/30/2023 1557   LABSPEC 1.005 11/30/2023 1557   PHURINE 8.0 11/30/2023 1557   GLUCOSEU NEGATIVE 11/30/2023 1557   HGBUR NEGATIVE 11/30/2023 1557   BILIRUBINUR NEGATIVE 11/30/2023 1557   KETONESUR NEGATIVE 11/30/2023 1557   PROTEINUR NEGATIVE 11/30/2023 1557   NITRITE NEGATIVE 11/30/2023 1557    LEUKOCYTESUR NEGATIVE 11/30/2023 1557   This document was prepared using Dragon Voice Recognition software and may include unintentional dictation errors.  Dr. Reinhold Carbine [facility]  If 7PM-7AM, please contact overnight-coverage provider If 7AM-7PM, please contact day attending provider [email or web address]  11/30/2023, 5:28 PM

## 2023-11-30 NOTE — Assessment & Plan Note (Signed)
 Serum magnesium level checked on admission Polycitra 30 mill equivalents p.o. one-time dose ordered Recheck BMP in a.m.

## 2023-11-30 NOTE — ED Notes (Signed)
 Admitting MD bedside

## 2023-11-30 NOTE — ED Notes (Signed)
 Assisted Pt to toilet multiple times with minimal assistance, gait steady
